# Patient Record
Sex: Male | Born: 1945 | Race: Black or African American | Hispanic: No | Marital: Married
Health system: Southern US, Community
[De-identification: ages and names within clinical notes are randomized; demographics above are authoritative.]

## PROBLEM LIST (undated history)

## (undated) DIAGNOSIS — N189 Chronic kidney disease, unspecified: Secondary | ICD-10-CM

## (undated) DIAGNOSIS — C801 Malignant (primary) neoplasm, unspecified: Secondary | ICD-10-CM

## (undated) DIAGNOSIS — E119 Type 2 diabetes mellitus without complications: Secondary | ICD-10-CM

## (undated) DIAGNOSIS — G709 Myoneural disorder, unspecified: Secondary | ICD-10-CM

## (undated) DIAGNOSIS — R569 Unspecified convulsions: Secondary | ICD-10-CM

## (undated) DIAGNOSIS — K219 Gastro-esophageal reflux disease without esophagitis: Secondary | ICD-10-CM

## (undated) DIAGNOSIS — I10 Essential (primary) hypertension: Secondary | ICD-10-CM

## (undated) HISTORY — PX: JOINT REPLACEMENT: SHX530

## (undated) HISTORY — PX: BACK SURGERY: SHX140

## (undated) HISTORY — PX: INSERTION PROSTATE RADIATION SEED: SUR718

## (undated) HISTORY — PX: TONSILLECTOMY: SUR1361

---

## 1999-09-21 ENCOUNTER — Ambulatory Visit (HOSPITAL_COMMUNITY): Admission: RE | Admit: 1999-09-21 | Discharge: 1999-09-21 | Payer: Self-pay | Admitting: Gastroenterology

## 2000-08-16 ENCOUNTER — Ambulatory Visit (HOSPITAL_COMMUNITY): Admission: RE | Admit: 2000-08-16 | Discharge: 2000-08-16 | Payer: Self-pay | Admitting: Family Medicine

## 2000-08-20 ENCOUNTER — Ambulatory Visit (HOSPITAL_COMMUNITY): Admission: RE | Admit: 2000-08-20 | Discharge: 2000-08-20 | Payer: Self-pay | Admitting: Family Medicine

## 2000-09-08 ENCOUNTER — Emergency Department (HOSPITAL_COMMUNITY): Admission: EM | Admit: 2000-09-08 | Discharge: 2000-09-08 | Payer: Self-pay | Admitting: *Deleted

## 2000-09-11 ENCOUNTER — Encounter: Payer: Self-pay | Admitting: Orthopedic Surgery

## 2000-09-11 ENCOUNTER — Ambulatory Visit (HOSPITAL_COMMUNITY): Admission: RE | Admit: 2000-09-11 | Discharge: 2000-09-11 | Payer: Self-pay | Admitting: Orthopedic Surgery

## 2000-09-14 ENCOUNTER — Emergency Department (HOSPITAL_COMMUNITY): Admission: EM | Admit: 2000-09-14 | Discharge: 2000-09-14 | Payer: Self-pay | Admitting: Emergency Medicine

## 2001-03-05 ENCOUNTER — Encounter: Admission: RE | Admit: 2001-03-05 | Discharge: 2001-06-03 | Payer: Self-pay | Admitting: Internal Medicine

## 2001-08-28 ENCOUNTER — Encounter: Payer: Self-pay | Admitting: Orthopedic Surgery

## 2001-08-28 ENCOUNTER — Ambulatory Visit: Admission: RE | Admit: 2001-08-28 | Discharge: 2001-08-28 | Payer: Self-pay | Admitting: Orthopedic Surgery

## 2001-09-22 ENCOUNTER — Encounter: Admission: RE | Admit: 2001-09-22 | Discharge: 2001-09-22 | Payer: Self-pay | Admitting: Internal Medicine

## 2001-09-22 ENCOUNTER — Encounter: Payer: Self-pay | Admitting: Internal Medicine

## 2001-12-11 ENCOUNTER — Encounter: Payer: Self-pay | Admitting: Gastroenterology

## 2001-12-11 ENCOUNTER — Inpatient Hospital Stay (HOSPITAL_COMMUNITY): Admission: RE | Admit: 2001-12-11 | Discharge: 2001-12-12 | Payer: Self-pay | Admitting: Gastroenterology

## 2002-01-28 ENCOUNTER — Encounter: Payer: Self-pay | Admitting: Internal Medicine

## 2002-01-28 ENCOUNTER — Encounter: Admission: RE | Admit: 2002-01-28 | Discharge: 2002-01-28 | Payer: Self-pay | Admitting: Internal Medicine

## 2002-02-09 ENCOUNTER — Inpatient Hospital Stay (HOSPITAL_COMMUNITY): Admission: RE | Admit: 2002-02-09 | Discharge: 2002-02-13 | Payer: Self-pay | Admitting: Orthopedic Surgery

## 2002-02-09 ENCOUNTER — Encounter: Payer: Self-pay | Admitting: Orthopedic Surgery

## 2003-10-20 ENCOUNTER — Ambulatory Visit (HOSPITAL_COMMUNITY): Admission: RE | Admit: 2003-10-20 | Discharge: 2003-10-20 | Payer: Self-pay | Admitting: Internal Medicine

## 2004-03-13 ENCOUNTER — Ambulatory Visit (HOSPITAL_COMMUNITY): Admission: RE | Admit: 2004-03-13 | Discharge: 2004-03-13 | Payer: Self-pay | Admitting: Gastroenterology

## 2004-03-13 ENCOUNTER — Encounter (INDEPENDENT_AMBULATORY_CARE_PROVIDER_SITE_OTHER): Payer: Self-pay | Admitting: Specialist

## 2004-12-14 ENCOUNTER — Ambulatory Visit (HOSPITAL_COMMUNITY): Admission: RE | Admit: 2004-12-14 | Discharge: 2004-12-14 | Payer: Self-pay | Admitting: Urology

## 2005-01-02 ENCOUNTER — Ambulatory Visit: Admission: RE | Admit: 2005-01-02 | Discharge: 2005-03-22 | Payer: Self-pay | Admitting: Radiation Oncology

## 2005-01-11 ENCOUNTER — Encounter: Admission: RE | Admit: 2005-01-11 | Discharge: 2005-01-11 | Payer: Self-pay | Admitting: Urology

## 2005-04-10 ENCOUNTER — Emergency Department (HOSPITAL_COMMUNITY): Admission: EM | Admit: 2005-04-10 | Discharge: 2005-04-10 | Payer: Self-pay | Admitting: Emergency Medicine

## 2005-05-21 ENCOUNTER — Ambulatory Visit (HOSPITAL_BASED_OUTPATIENT_CLINIC_OR_DEPARTMENT_OTHER): Admission: RE | Admit: 2005-05-21 | Discharge: 2005-05-21 | Payer: Self-pay | Admitting: Urology

## 2005-05-21 ENCOUNTER — Ambulatory Visit (HOSPITAL_COMMUNITY): Admission: RE | Admit: 2005-05-21 | Discharge: 2005-05-21 | Payer: Self-pay | Admitting: Urology

## 2005-05-21 ENCOUNTER — Ambulatory Visit: Admission: RE | Admit: 2005-05-21 | Discharge: 2005-07-04 | Payer: Self-pay | Admitting: Radiation Oncology

## 2006-02-23 ENCOUNTER — Encounter: Admission: RE | Admit: 2006-02-23 | Discharge: 2006-02-23 | Payer: Self-pay | Admitting: *Deleted

## 2006-03-04 ENCOUNTER — Ambulatory Visit (HOSPITAL_COMMUNITY): Admission: RE | Admit: 2006-03-04 | Discharge: 2006-03-04 | Payer: Self-pay | Admitting: *Deleted

## 2006-06-03 ENCOUNTER — Encounter: Admission: RE | Admit: 2006-06-03 | Discharge: 2006-06-03 | Payer: Self-pay | Admitting: Orthopedic Surgery

## 2006-11-30 ENCOUNTER — Inpatient Hospital Stay (HOSPITAL_COMMUNITY): Admission: EM | Admit: 2006-11-30 | Discharge: 2006-11-30 | Payer: Self-pay | Admitting: Emergency Medicine

## 2006-11-30 ENCOUNTER — Ambulatory Visit: Payer: Self-pay | Admitting: Internal Medicine

## 2007-05-30 ENCOUNTER — Encounter: Admission: RE | Admit: 2007-05-30 | Discharge: 2007-05-30 | Payer: Self-pay | Admitting: Internal Medicine

## 2007-12-04 ENCOUNTER — Encounter: Admission: RE | Admit: 2007-12-04 | Discharge: 2007-12-04 | Payer: Self-pay | Admitting: Gastroenterology

## 2008-01-01 ENCOUNTER — Encounter (INDEPENDENT_AMBULATORY_CARE_PROVIDER_SITE_OTHER): Payer: Self-pay | Admitting: Gastroenterology

## 2008-01-01 ENCOUNTER — Ambulatory Visit (HOSPITAL_COMMUNITY): Admission: RE | Admit: 2008-01-01 | Discharge: 2008-01-01 | Payer: Self-pay | Admitting: Gastroenterology

## 2008-05-07 ENCOUNTER — Ambulatory Visit: Payer: Self-pay | Admitting: Physical Medicine & Rehabilitation

## 2008-05-07 ENCOUNTER — Inpatient Hospital Stay (HOSPITAL_COMMUNITY)
Admission: RE | Admit: 2008-05-07 | Discharge: 2008-05-25 | Payer: Self-pay | Admitting: Physical Medicine & Rehabilitation

## 2008-05-25 ENCOUNTER — Inpatient Hospital Stay (HOSPITAL_COMMUNITY): Admission: EM | Admit: 2008-05-25 | Discharge: 2008-05-27 | Payer: Self-pay | Admitting: Emergency Medicine

## 2008-06-16 ENCOUNTER — Encounter
Admission: RE | Admit: 2008-06-16 | Discharge: 2008-08-17 | Payer: Self-pay | Admitting: Physical Medicine & Rehabilitation

## 2008-06-21 ENCOUNTER — Ambulatory Visit: Payer: Self-pay | Admitting: Physical Medicine & Rehabilitation

## 2008-07-01 ENCOUNTER — Encounter
Admission: RE | Admit: 2008-07-01 | Discharge: 2008-07-29 | Payer: Self-pay | Admitting: Physical Medicine & Rehabilitation

## 2008-08-09 ENCOUNTER — Ambulatory Visit: Payer: Self-pay | Admitting: Physical Medicine & Rehabilitation

## 2008-10-05 ENCOUNTER — Ambulatory Visit: Payer: Self-pay | Admitting: Physical Medicine & Rehabilitation

## 2008-10-05 ENCOUNTER — Encounter
Admission: RE | Admit: 2008-10-05 | Discharge: 2008-12-06 | Payer: Self-pay | Admitting: Physical Medicine & Rehabilitation

## 2008-10-19 ENCOUNTER — Encounter
Admission: RE | Admit: 2008-10-19 | Discharge: 2008-12-15 | Payer: Self-pay | Admitting: Physical Medicine & Rehabilitation

## 2008-12-06 ENCOUNTER — Ambulatory Visit: Payer: Self-pay | Admitting: Physical Medicine & Rehabilitation

## 2009-01-13 ENCOUNTER — Encounter
Admission: RE | Admit: 2009-01-13 | Discharge: 2009-04-13 | Payer: Self-pay | Admitting: Physical Medicine & Rehabilitation

## 2009-01-17 ENCOUNTER — Ambulatory Visit: Payer: Self-pay | Admitting: Physical Medicine & Rehabilitation

## 2010-01-06 IMAGING — CR DG ABDOMEN 1V
1 series · 1 of 1 positions shown · non-contrast
Comparison: None.

CLINICAL DATA: A follow-up ileus.

ABDOMEN - 1 VIEW

[t abdomen supine]
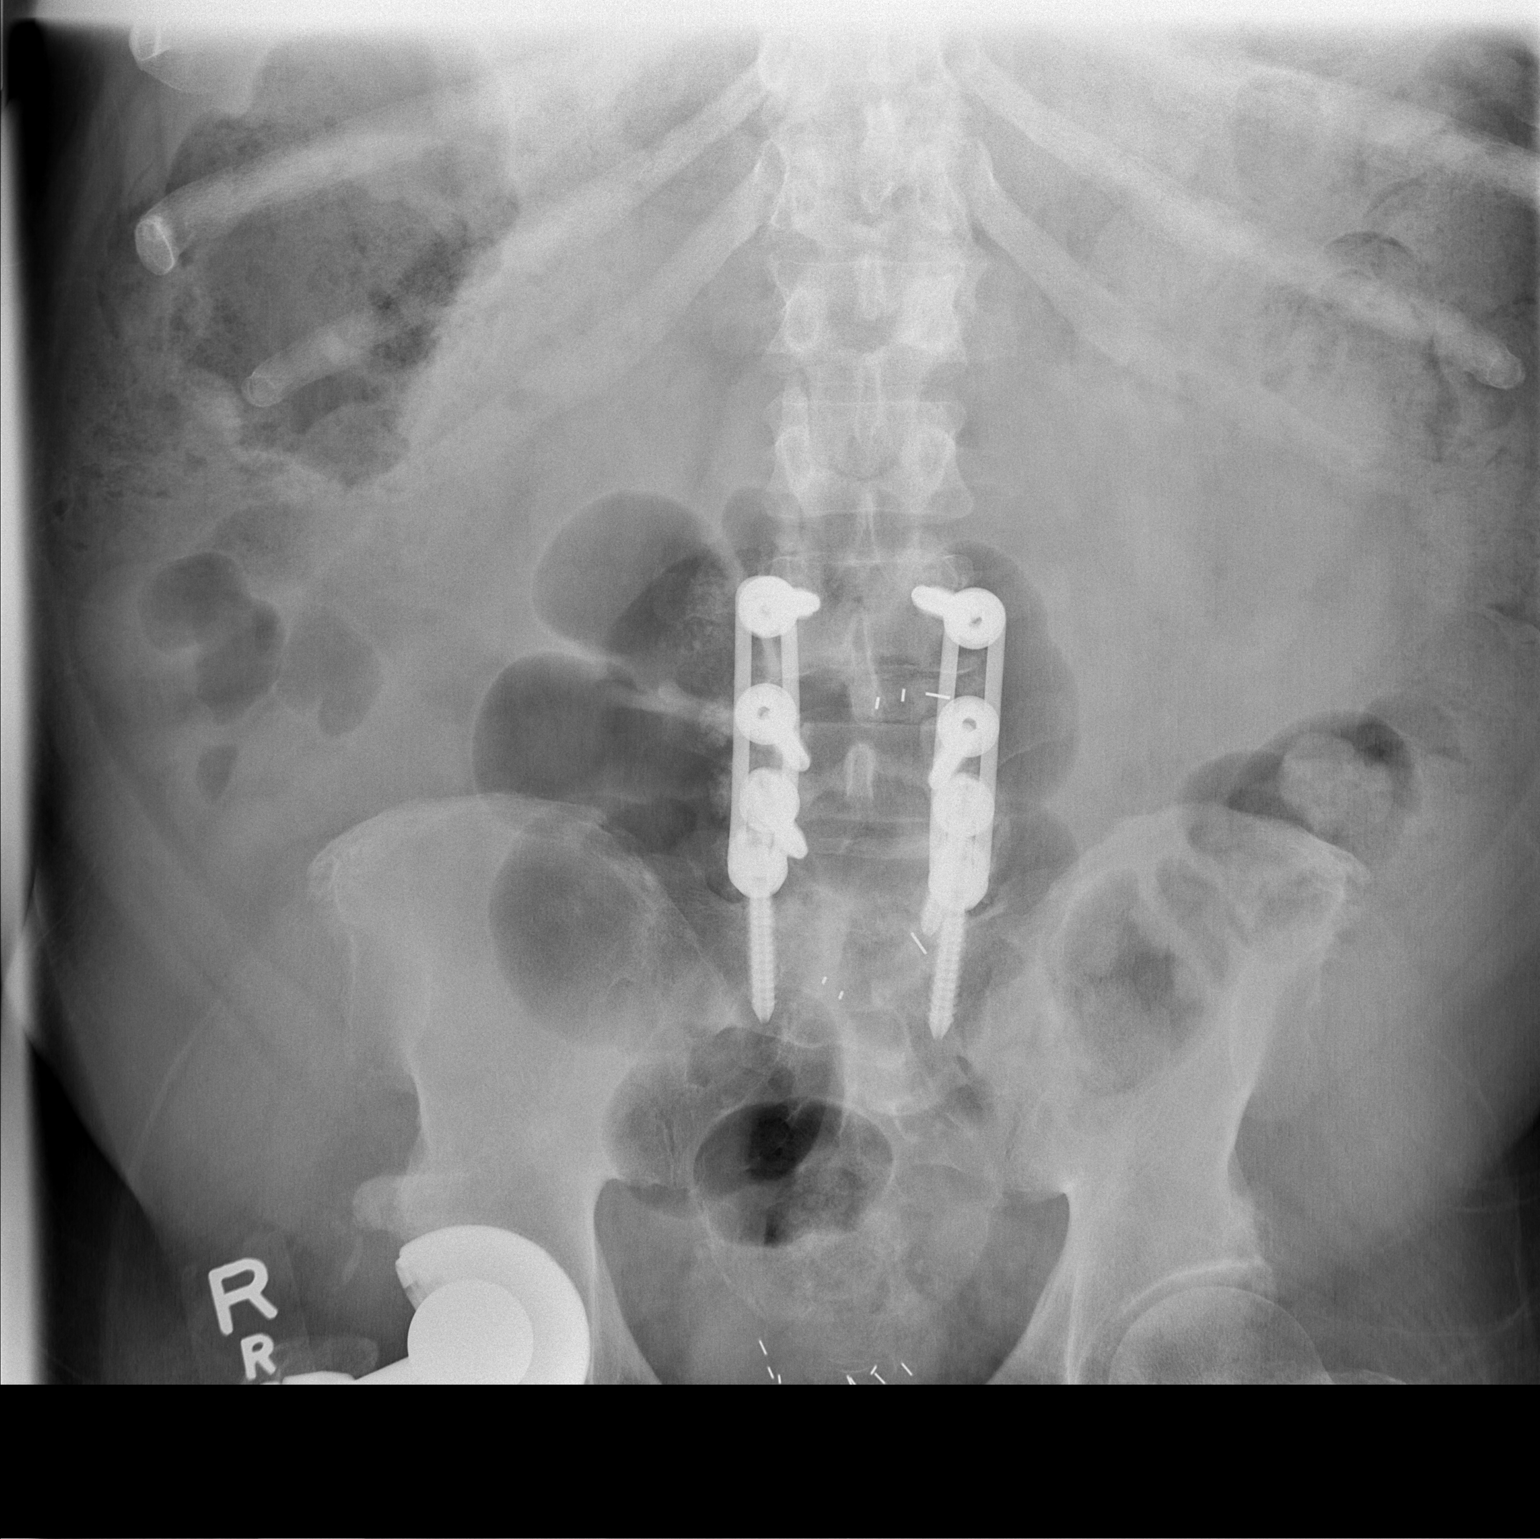

[1 of 1 positions shown; findings below may reference images not displayed]

FINDINGS: Mildly prominent gas filled sigmoid colon.  This is not
abnormally dilated.  Prominent stool throughout the remainder of
the colon, with the exception of the rectum.  No small bowel
dilatation visualized.  Right total hip prosthesis.  Hardware
fixation of the lower lumbar spine.  Prostate radiation seed
implants.
IMPRESSION: No acute abnormality.  Prominent stool.

## 2010-01-21 IMAGING — CT CT ANGIO CHEST
2 of 7 series · 19 of 36 positions shown · IV contrast (APPLIED)
Comparison: Plain film chest 1 day prior.  No prior CT.

CLINICAL DATA: Chest pain.  Syncope.

CT ANGIOGRAPHY CHEST
TECHNIQUE: Multidetector CT imaging of the chest using the
standard protocol during bolus administration of intravenous
contrast. Multiplanar reconstructed images including MIPs were
obtained and reviewed to evaluate the vascular anatomy.
Contrast: 100 ml Cmnipaque-SCC

[Series 8: pulm embolism 1.0 b25f thins · axial · 0.79mm/px · z∈[+1016,+1296]mm · 18 of 314 slices shown]
[im 17/314  lung]
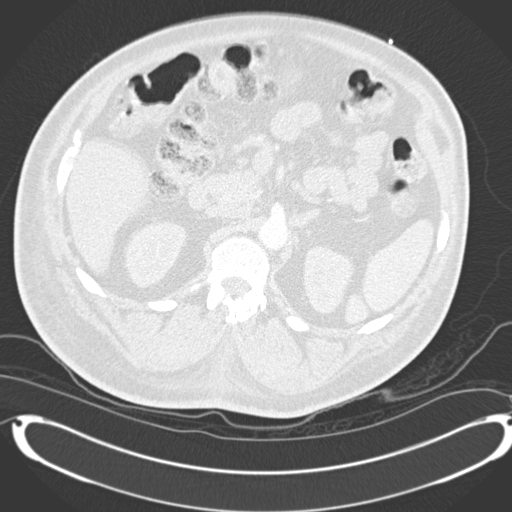
[im 33/314  mediastinal]
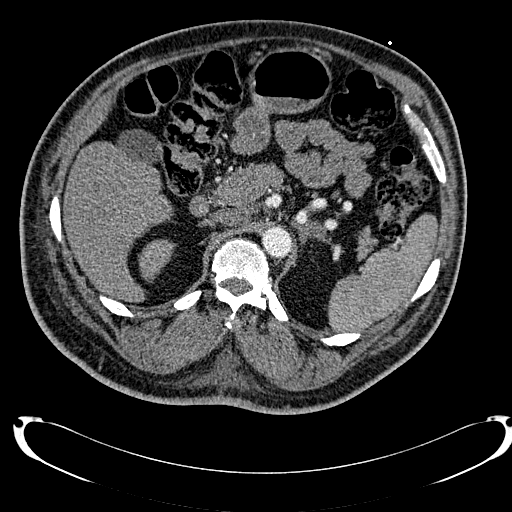
[im 50/314  lung]
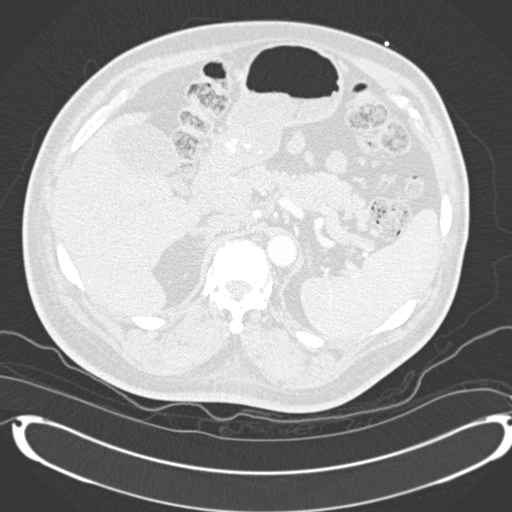
[im 66/314  mediastinal]
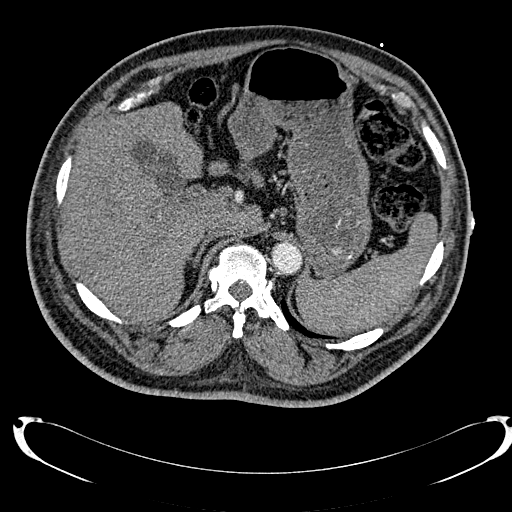
[im 83/314  lung]
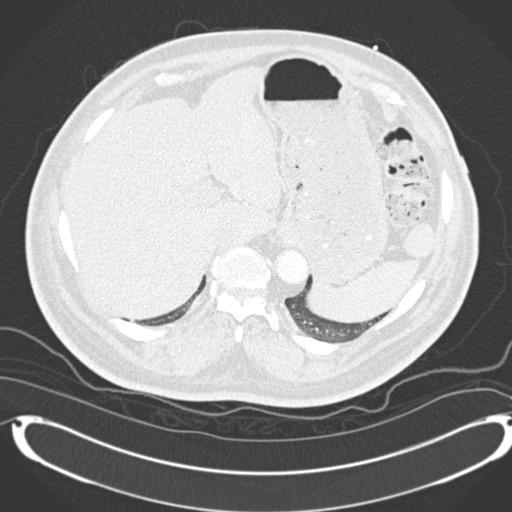
[im 99/314  mediastinal]
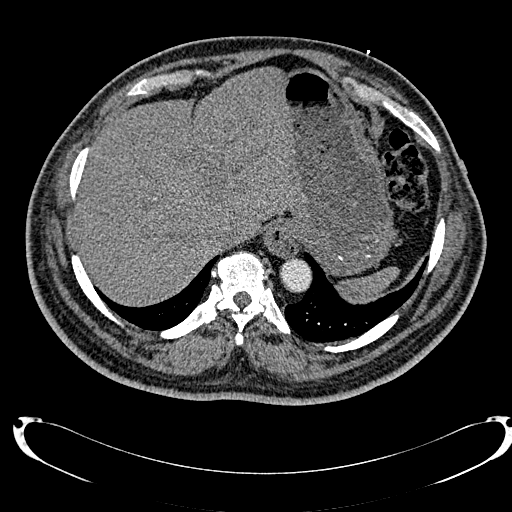
[im 116/314  lung]
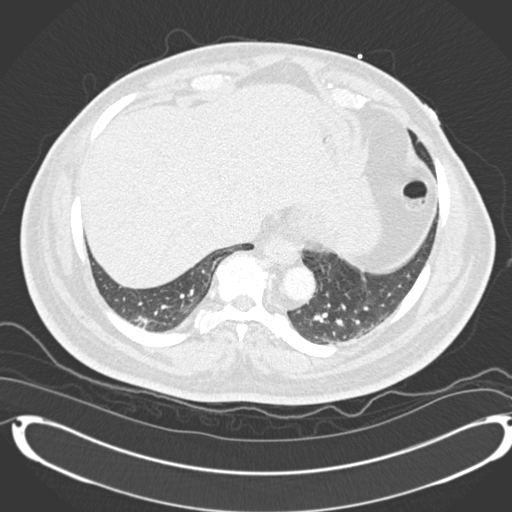
[im 132/314  mediastinal]
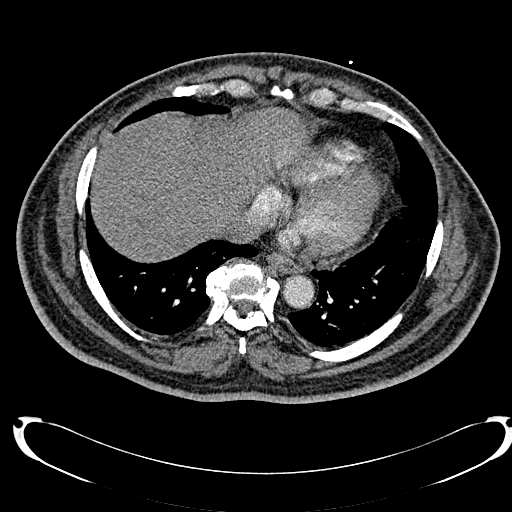
[im 149/314  lung]
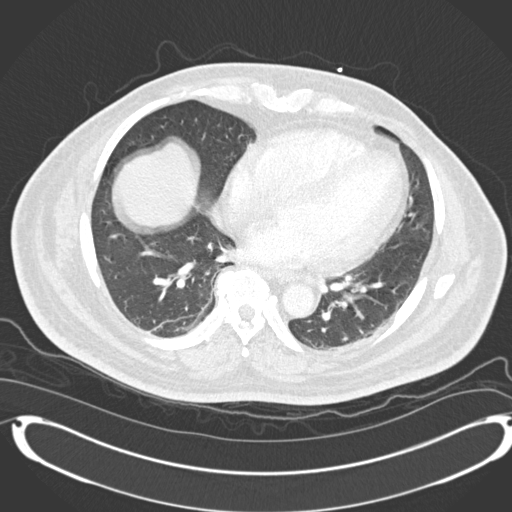
[im 165/314  mediastinal]
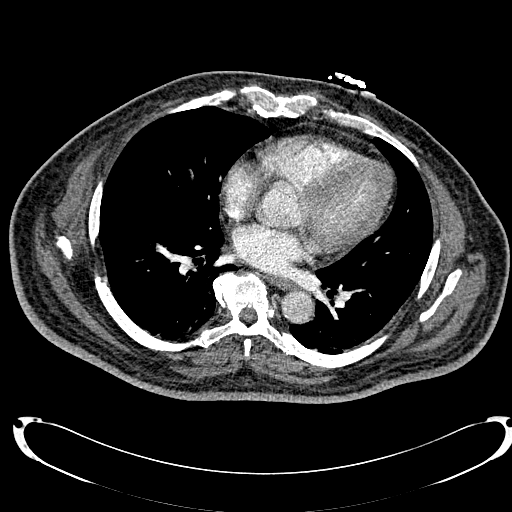
[im 182/314  lung]
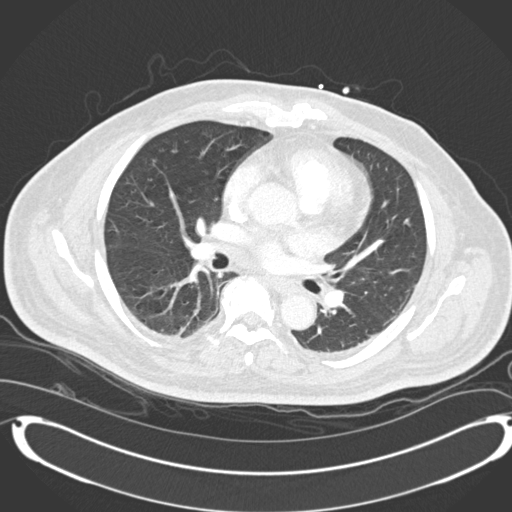
[im 198/314  mediastinal]
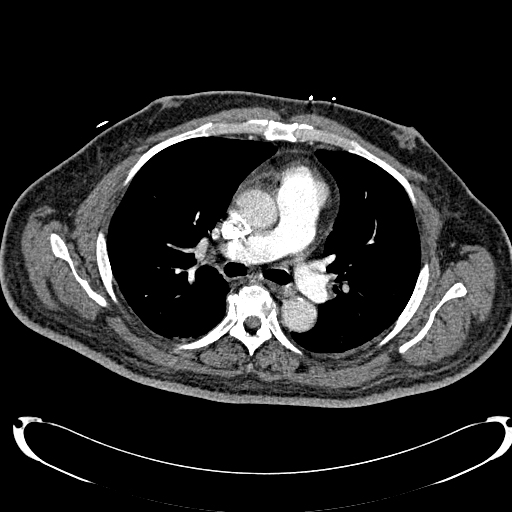
[im 215/314  lung]
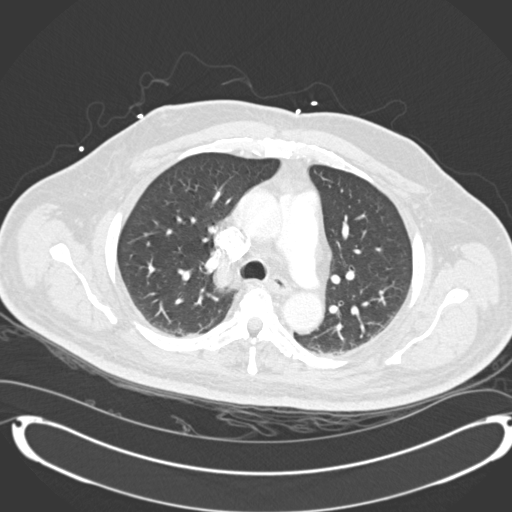
[im 231/314  mediastinal]
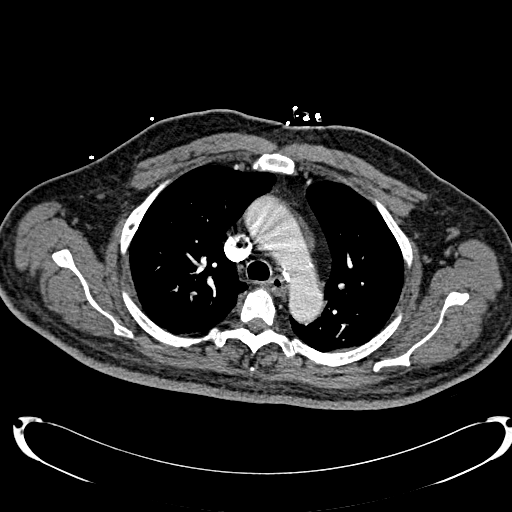
[im 248/314  lung]
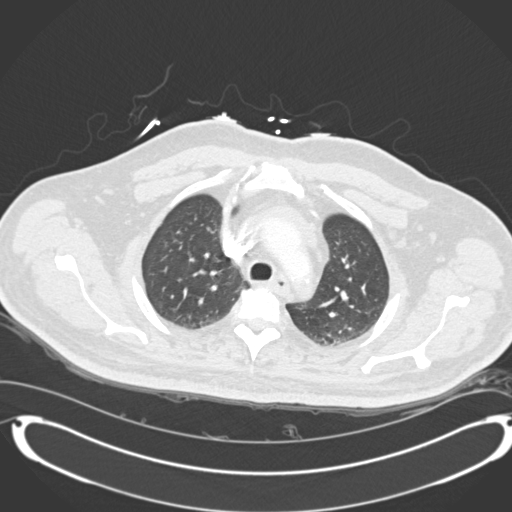
[im 264/314  mediastinal]
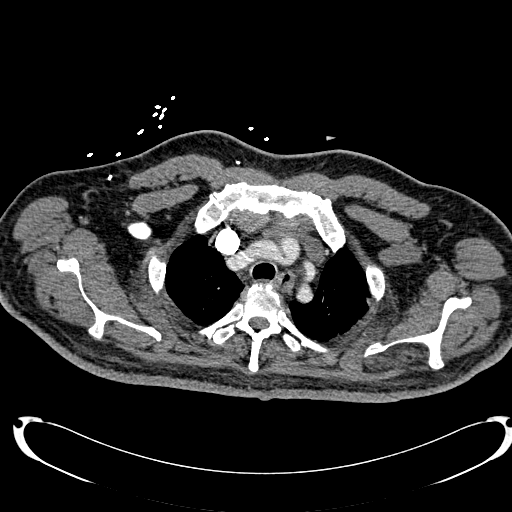
[im 281/314  lung]
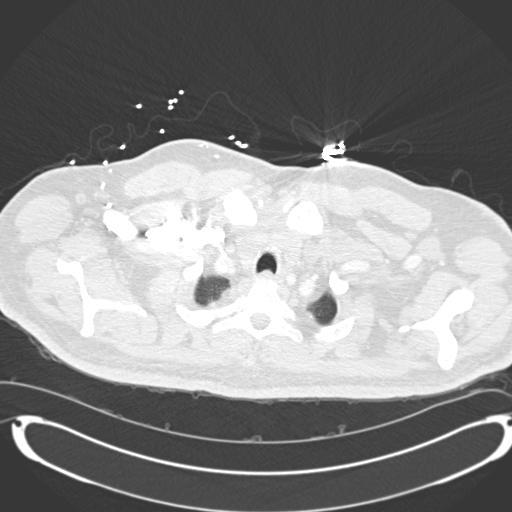
[im 297/314  mediastinal]
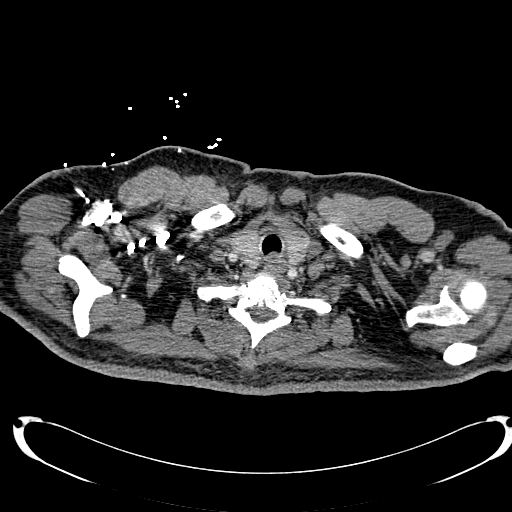

[Series 9: pulm embolism 2.0 spo thins · coronal · 0.69mm/px · 1 of 129 slices shown]
[im 65/129  mediastinal]
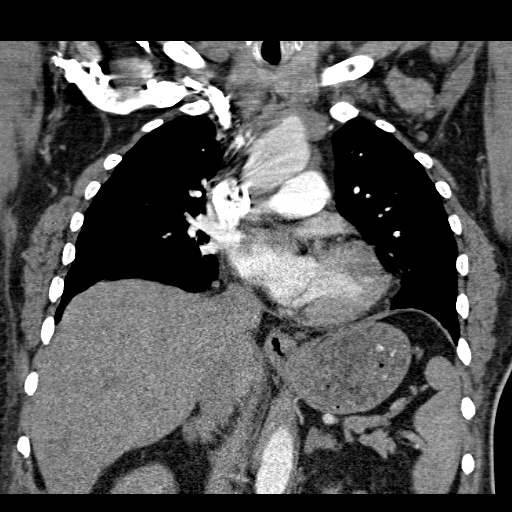

[19 of 36 positions shown; findings below may reference images not displayed]

FINDINGS: Lung windows demonstrate mild motion degradation.  Right
base scar or atelectasis.

Soft tissue windows:  The quality of this exam for evaluation of
pulmonary embolism is moderate to good.  The primary limitation is
the above described motion artifact.  There is a large amount of
contrast remaining in the SVC.  No central or lobar embolism.  No
large segmental embolism.

Normal aortic caliber without dissection.  Mild cardiomegaly with
left ventricular hypertrophy. No pericardial or pleural effusion.
No mediastinal or hilar adenopathy. Small hiatal hernia.

Limited abdominal imaging demonstrates possible gallstone.
IMPRESSION: 1.  No evidence of pulmonary embolism.  Moderate quality exam with
primary limitations involving the upper lobe.
2.  Cardiomegaly.
3.  Small hiatal hernia.
4.  Possible gallstone versus gallbladder fold.

## 2010-07-16 ENCOUNTER — Encounter: Payer: Self-pay | Admitting: Urology

## 2010-08-06 ENCOUNTER — Emergency Department (HOSPITAL_COMMUNITY): Payer: MEDICARE

## 2010-08-06 ENCOUNTER — Inpatient Hospital Stay (HOSPITAL_COMMUNITY)
Admission: EM | Admit: 2010-08-06 | Discharge: 2010-08-10 | DRG: 872 | Disposition: A | Discharge status: Home / Self Care | Payer: MEDICARE | Attending: Internal Medicine | Admitting: Internal Medicine

## 2010-08-06 DIAGNOSIS — K5190 Ulcerative colitis, unspecified, without complications: Secondary | ICD-10-CM | POA: Diagnosis present

## 2010-08-06 DIAGNOSIS — R748 Abnormal levels of other serum enzymes: Secondary | ICD-10-CM | POA: Diagnosis present

## 2010-08-06 DIAGNOSIS — E860 Dehydration: Secondary | ICD-10-CM | POA: Diagnosis present

## 2010-08-06 DIAGNOSIS — E1169 Type 2 diabetes mellitus with other specified complication: Secondary | ICD-10-CM | POA: Diagnosis present

## 2010-08-06 DIAGNOSIS — N319 Neuromuscular dysfunction of bladder, unspecified: Secondary | ICD-10-CM | POA: Diagnosis present

## 2010-08-06 DIAGNOSIS — N390 Urinary tract infection, site not specified: Secondary | ICD-10-CM | POA: Diagnosis present

## 2010-08-06 DIAGNOSIS — M51379 Other intervertebral disc degeneration, lumbosacral region without mention of lumbar back pain or lower extremity pain: Secondary | ICD-10-CM | POA: Diagnosis present

## 2010-08-06 DIAGNOSIS — I10 Essential (primary) hypertension: Secondary | ICD-10-CM | POA: Diagnosis present

## 2010-08-06 DIAGNOSIS — K592 Neurogenic bowel, not elsewhere classified: Secondary | ICD-10-CM | POA: Diagnosis present

## 2010-08-06 DIAGNOSIS — C61 Malignant neoplasm of prostate: Secondary | ICD-10-CM | POA: Diagnosis present

## 2010-08-06 DIAGNOSIS — A419 Sepsis, unspecified organism: Secondary | ICD-10-CM | POA: Diagnosis present

## 2010-08-06 DIAGNOSIS — Z794 Long term (current) use of insulin: Secondary | ICD-10-CM

## 2010-08-06 DIAGNOSIS — K219 Gastro-esophageal reflux disease without esophagitis: Secondary | ICD-10-CM | POA: Diagnosis present

## 2010-08-06 DIAGNOSIS — E785 Hyperlipidemia, unspecified: Secondary | ICD-10-CM | POA: Diagnosis present

## 2010-08-06 DIAGNOSIS — K8309 Other cholangitis: Secondary | ICD-10-CM | POA: Diagnosis present

## 2010-08-06 DIAGNOSIS — M5137 Other intervertebral disc degeneration, lumbosacral region: Secondary | ICD-10-CM | POA: Diagnosis present

## 2010-08-06 LAB — POCT CARDIAC MARKERS
CKMB, poc: 17.3 ng/mL (ref 1.0–8.0)
Myoglobin, poc: >500 ng/mL (ref 12–200)
Troponin i, poc: <0.05 ng/mL (ref 0.00–0.09)

## 2010-08-06 LAB — DIFFERENTIAL
Basophils Absolute: 0 10*3/uL (ref 0.0–0.1)
Eosinophils Relative: 0 % (ref 0–5)
Lymphocytes Relative: 3 % — ABNORMAL LOW (ref 12–46)
Lymphs Abs: 0.7 10*3/uL (ref 0.7–4.0)
Monocytes Absolute: 0.6 10*3/uL (ref 0.1–1.0)
Monocytes Relative: 3 % (ref 3–12)
Neutro Abs: 20.8 10*3/uL — ABNORMAL HIGH (ref 1.7–7.7)
Neutrophils Relative %: 94 % — ABNORMAL HIGH (ref 43–77)

## 2010-08-06 LAB — CK TOTAL AND CKMB (NOT AT ARMC)
CK, MB: 11.6 ng/mL (ref 0.3–4.0)
Relative Index: 1.7 (ref 0.0–2.5)
Total CK: 715 U/L — ABNORMAL HIGH (ref 7–232)

## 2010-08-06 LAB — AMMONIA: Ammonia: 32 umol/L (ref 11–35)

## 2010-08-06 LAB — CBC
HCT: 38.7 % — ABNORMAL LOW (ref 39.0–52.0)
Hemoglobin: 12.8 g/dL — ABNORMAL LOW (ref 13.0–17.0)
MCH: 26.8 pg (ref 26.0–34.0)
MCHC: 33.1 g/dL (ref 30.0–36.0)
MCV: 81.1 fL (ref 78.0–100.0)
Platelets: 265 10*3/uL (ref 150–400)
RBC: 4.77 MIL/uL (ref 4.22–5.81)
RDW: 13.8 % (ref 11.5–15.5)
WBC: 22.1 10*3/uL — ABNORMAL HIGH (ref 4.0–10.5)

## 2010-08-06 LAB — BASIC METABOLIC PANEL
BUN: 19 mg/dL (ref 6–23)
CO2: 26 mEq/L (ref 19–32)
Calcium: 9.8 mg/dL (ref 8.4–10.5)
Chloride: 103 mEq/L (ref 96–112)
Creatinine, Ser: 1.63 mg/dL — ABNORMAL HIGH (ref 0.4–1.5)
GFR calc Af Amer: 52 mL/min — ABNORMAL LOW (ref >60)
GFR calc non Af Amer: 43 mL/min — ABNORMAL LOW (ref >60)
Glucose, Bld: 78 mg/dL (ref 70–99)
Potassium: 3.3 mEq/L — ABNORMAL LOW (ref 3.5–5.1)
Sodium: 140 mEq/L (ref 135–145)

## 2010-08-06 LAB — TROPONIN I: Troponin I: 0.06 ng/mL (ref 0.00–0.06)

## 2010-08-07 LAB — DIFFERENTIAL
Basophils Absolute: 0 10*3/uL (ref 0.0–0.1)
Basophils Relative: 0 % (ref 0–1)
Eosinophils Absolute: 0 10*3/uL (ref 0.0–0.7)
Monocytes Relative: 7 % (ref 3–12)
Neutro Abs: 18.7 10*3/uL — ABNORMAL HIGH (ref 1.7–7.7)
Neutrophils Relative %: 89 % — ABNORMAL HIGH (ref 43–77)

## 2010-08-07 LAB — CBC
Hemoglobin: 9.8 g/dL — ABNORMAL LOW (ref 13.0–17.0)
MCH: 26.5 pg (ref 26.0–34.0)
MCHC: 32.8 g/dL (ref 30.0–36.0)
Platelets: 236 10*3/uL (ref 150–400)
RBC: 3.7 MIL/uL — ABNORMAL LOW (ref 4.22–5.81)

## 2010-08-07 LAB — LIPID PANEL
HDL: 27 mg/dL — ABNORMAL LOW (ref >39)
LDL Cholesterol: 81 mg/dL (ref 0–99)
Total CHOL/HDL Ratio: 4.9 RATIO
Triglycerides: 113 mg/dL (ref <150)
VLDL: 23 mg/dL (ref 0–40)

## 2010-08-07 LAB — URINALYSIS, ROUTINE W REFLEX MICROSCOPIC
Specific Gravity, Urine: 1.02 (ref 1.005–1.030)
Urine Glucose, Fasting: NEGATIVE mg/dL
pH: 6 (ref 5.0–8.0)

## 2010-08-07 LAB — URINE MICROSCOPIC-ADD ON

## 2010-08-07 LAB — BASIC METABOLIC PANEL
BUN: 23 mg/dL (ref 6–23)
Chloride: 103 mEq/L (ref 96–112)
Creatinine, Ser: 1.65 mg/dL — ABNORMAL HIGH (ref 0.4–1.5)
GFR calc Af Amer: 51 mL/min — ABNORMAL LOW (ref >60)
GFR calc non Af Amer: 42 mL/min — ABNORMAL LOW (ref >60)

## 2010-08-07 LAB — CK TOTAL AND CKMB (NOT AT ARMC)
CK, MB: 17.4 ng/mL (ref 0.3–4.0)
Total CK: 1642 U/L — ABNORMAL HIGH (ref 7–232)

## 2010-08-07 LAB — GLUCOSE, CAPILLARY
Glucose-Capillary: 182 mg/dL — ABNORMAL HIGH (ref 70–99)
Glucose-Capillary: 199 mg/dL — ABNORMAL HIGH (ref 70–99)
Glucose-Capillary: 216 mg/dL — ABNORMAL HIGH (ref 70–99)
Glucose-Capillary: 327 mg/dL — ABNORMAL HIGH (ref 70–99)

## 2010-08-07 LAB — HEMOGLOBIN A1C
Hgb A1c MFr Bld: 6.8 % — ABNORMAL HIGH (ref <5.7)
Mean Plasma Glucose: 148 mg/dL — ABNORMAL HIGH (ref <117)

## 2010-08-07 LAB — LACTIC ACID, PLASMA: Lactic Acid, Venous: 1.8 mmol/L (ref 0.5–2.2)

## 2010-08-08 LAB — DIFFERENTIAL
Basophils Absolute: 0 10*3/uL (ref 0.0–0.1)
Lymphocytes Relative: 9 % — ABNORMAL LOW (ref 12–46)
Lymphs Abs: 1.3 10*3/uL (ref 0.7–4.0)
Monocytes Absolute: 0.7 10*3/uL (ref 0.1–1.0)
Monocytes Relative: 5 % (ref 3–12)
Neutro Abs: 12.2 10*3/uL — ABNORMAL HIGH (ref 1.7–7.7)

## 2010-08-08 LAB — GLUCOSE, CAPILLARY: Glucose-Capillary: 161 mg/dL — ABNORMAL HIGH (ref 70–99)

## 2010-08-08 LAB — CBC
HCT: 29.9 % — ABNORMAL LOW (ref 39.0–52.0)
Hemoglobin: 9.8 g/dL — ABNORMAL LOW (ref 13.0–17.0)
MCHC: 32.8 g/dL (ref 30.0–36.0)
MCV: 80.4 fL (ref 78.0–100.0)

## 2010-08-08 LAB — BASIC METABOLIC PANEL
BUN: 28 mg/dL — ABNORMAL HIGH (ref 6–23)
CO2: 24 mEq/L (ref 19–32)
Calcium: 9 mg/dL (ref 8.4–10.5)
Glucose, Bld: 234 mg/dL — ABNORMAL HIGH (ref 70–99)
Sodium: 138 mEq/L (ref 135–145)

## 2010-08-08 LAB — URINE CULTURE
Culture  Setup Time: 201202131158
Special Requests: NEGATIVE

## 2010-08-08 LAB — CARDIAC PANEL(CRET KIN+CKTOT+MB+TROPI)
CK, MB: 5.4 ng/mL — ABNORMAL HIGH (ref 0.3–4.0)
Troponin I: 0.11 ng/mL — ABNORMAL HIGH (ref 0.00–0.06)

## 2010-08-09 LAB — URINALYSIS, MICROSCOPIC ONLY
Bilirubin Urine: NEGATIVE
Ketones, ur: NEGATIVE mg/dL
Nitrite: NEGATIVE
Protein, ur: 30 mg/dL — AB
Specific Gravity, Urine: 1.013 (ref 1.005–1.030)
Urine Glucose, Fasting: NEGATIVE mg/dL
Urobilinogen, UA: 1 mg/dL (ref 0.0–1.0)
pH: 6 (ref 5.0–8.0)

## 2010-08-09 LAB — CBC
HCT: 28.9 % — ABNORMAL LOW (ref 39.0–52.0)
Hemoglobin: 9.4 g/dL — ABNORMAL LOW (ref 13.0–17.0)
MCHC: 32.5 g/dL (ref 30.0–36.0)
MCV: 80.5 fL (ref 78.0–100.0)
WBC: 11.8 10*3/uL — ABNORMAL HIGH (ref 4.0–10.5)

## 2010-08-09 LAB — GLUCOSE, CAPILLARY
Glucose-Capillary: 204 mg/dL — ABNORMAL HIGH (ref 70–99)
Glucose-Capillary: 185 mg/dL — ABNORMAL HIGH (ref 70–99)

## 2010-08-09 LAB — BASIC METABOLIC PANEL
CO2: 27 mEq/L (ref 19–32)
Glucose, Bld: 191 mg/dL — ABNORMAL HIGH (ref 70–99)
Potassium: 3.9 mEq/L (ref 3.5–5.1)
Sodium: 142 mEq/L (ref 135–145)

## 2010-08-10 LAB — BASIC METABOLIC PANEL
CO2: 25 mEq/L (ref 19–32)
Calcium: 9.4 mg/dL (ref 8.4–10.5)
Creatinine, Ser: 1.18 mg/dL (ref 0.4–1.5)
Glucose, Bld: 158 mg/dL — ABNORMAL HIGH (ref 70–99)

## 2010-08-13 LAB — WOUND CULTURE

## 2010-08-18 NOTE — Discharge Summary (Signed)
NAME:  Hunter Moss, Hunter Moss NO.:  [id number]  MEDICAL RECORD NO.:  [id number]           PATIENT TYPE:  I  LOCATION:  1427                         FACILITY:  [facility]  PHYSICIAN:  Thora Lance, [name]  DATE OF BIRTH:  December 21, 1945  DATE OF ADMISSION:  08/06/2010 DATE OF DISCHARGE:                              DISCHARGE SUMMARY   REASON FOR ADMISSION:  65 year old white male with multiple medical problems, was brought to emergency room by ambulance, has been having confusion and low blood sugar in the 40s.  In the ER, was found to be hypertensive with blood pressure systolic in the 80s.  The patient had been having trouble with hypoglycemia several days prior to admission. He was given IV dextrose and blood sugar improved with increased responsiveness.  SIGNIFICANT FINDINGS:  VITAL SIGNS:  Blood pressure 113/70, heart rate 113, respirations 20, temperature 99.5. LUNGS:  Clear. HEART:  Regular rate and rhythm. ABDOMEN:  Soft, nontender, normal bowel sounds. EXTREMITIES:  No edema. NEUROLOGIC:  Nonfocal, except for 3 out of 5 strength in both lower extremities.  LABORATORY DATA:  WBC 22.1, hemoglobin 12.8, platelet count 265,000. Sodium 140, potassium 3.5, chloride 103, bicarbonate 26, BUN 19, creatinine 1.63, glucose 78, myoglobin 500, CK-MB 17.3, troponin-I less than 0.05, ammonia level 32.  Urinalysis, large leukocyte esterase, positive nitrite, wbc's too numerous to count, rbc's 11, many bacteria.  HOSPITAL COURSE:  The patient was admitted with sepsis related to urinary tract infection.  He was given Rocephin and IV fluids and his hypotension quickly resolved.  He was then switched to vancomycin and Zosyn.  His urine culture surprisingly showed no growth.  He did spike a fever to 103 on his first hospital day and then his fever curve generally improved and at discharge, she was afebrile.  Followup urinalysis showed 7 to 10 wbc's.  He clinically improved with  increasing appetite.  He was seen by occupational therapy and able to get up and transfer to his wheelchair during the hospitalization.  His blood sugars initially were low but then rose in to the 300s and he was started on Lantus and sliding scale insulin.  When his creatinine dropped from 1.6 to 1.2, his metformin was restarted.  IV fluids were discontinued.  His troponin level did rise to a level of 0.21 on February 13.  He never had any chest pain and followup cardiac panel showed troponin of 0.11.  He was felt to have some evidence of possible myocardial ischemia secondary to his hypotension and sepsis.  We will consider working this up further as an outpatient.  At discharge, the patient was eating well and was discharged in good condition.  DISCHARGE DIAGNOSES: 1. Sepsis with urinary tract infection. 2. Diabetes mellitus. 3. Hypoglycemia. 4. Hypertension. 5. Hyperlipidemia. 6. Gastroesophageal reflux disease. 7. Lumbosacral degenerative disk disease. 8. Primary sclerosing cholangitis. 9. Ulcerative colitis. 10.Prostate cancer. 11.Neurogenic bowel and bladder.  PROCEDURES:  None.  DISCHARGE MEDICATIONS: 1. Insulin glargine 30 units subcutaneous daily. 2. Levaquin 500 mg 1 tablet once a day for 7 days. 3. NovoLog insulin 10 units before each meal. 4. Amlodipine 5  mg once a day. 5. Aspirin 325 mg once a day. 6. Benazepril 40 mg once a day. 7. Glipizide ER 10 mg once a day. 8. Metformin 1000 mg twice a day. 9. Multivitamin 1 tablet once a day. 10.Protonix 40 mg once a day. 11.Ursodiol 500 mg twice a day. 12.Hydrochlorothiazide was discontinued.  DISPOSITION:  Discharged to home.  CODE STATUS:  Full code.  FOLLOWUP:  Follow up in 10 days with Dr. Valentina Lucks, home RN and physical therapy set up.  DIET:  Low-sodium, carbohydrate modified diet.          ______________________________ Thora Lance, [name]     JJG/MEDQ  D:  08/10/2010  T:  08/10/2010  Job:   [id number]  Electronically Signed by Kirby Funk [name] on 08/18/2010 12:35:56 PM

## 2010-08-28 NOTE — H&P (Signed)
NAME:  Hunter Moss, Hunter Moss NO.:  [id number]  MEDICAL RECORD NO.:  [id number]           PATIENT TYPE:  E  LOCATION:  WLED                         FACILITY:  [facility]  PHYSICIAN:  Della Goo, [name] DATE OF BIRTH:  1945-08-15  DATE OF ADMISSION:  08/06/2010 DATE OF DISCHARGE:                             HISTORY & PHYSICAL   DATE OF ADMISSION:  August 07, 2010.  PRIMARY CARE PHYSICIAN:  Thora Lance, [name]  CHIEF COMPLAINT:  Confusion, low blood sugar.  HISTORY OF PRESENT ILLNESS:  This is a 65 year old male with multiple medical problems who was brought to the emergency department by ambulance after having confusion and low blood sugar into the 40s.  When EMS arrived, the patient was also found to be hypotensive with blood pressures in the 80s.  On the scene, they also checked the patient's blood sugars and found to be 96 on the scene.  The patient has been having difficulty with his blood sugars and having low blood sugars. EMS has been called out the day before and his blood sugar was found to be in the 40s.  The patient was given IV dextrose and his blood sugar improved.  The patient became more responsive at that time but refused to go to the hospital during that day.  The patient reports having poor appetite over the past 3 months and not being able to eat.  He reports having nausea and dry heaves.  His wife reports that he has not eaten any foods since 5 p.m. on Saturday which is a day-and-a-half ago.  He also has not been drinking any fluids.  The patient denies having any fevers or chills.  PAST MEDICAL HISTORY:  Significant for insulin-dependent diabetes mellitus, hypertension, hyperlipidemia, gastroesophageal reflux disease, lumbar disk disease status post laminectomy and diskectomy at L3-L4 and L4-L5, history of primary sclerosing cholangitis, ulcerative colitis, status post right total hip replacement, history of prostate cancer status post  radiation seed implant, bilateral lower extremity numbness and left-sided footdrop.  The patient has lower extremity spasticity, left greater than right, and he has lumbosacral stenosis with myelopathy and neurogenic bowel and bladder.  MEDICATIONS:  His medications include ursodiol, metformin, benazepril, glipizide, amlodipine, aspirin, Protonix, multivitamin, NovoLog insulin.  ALLERGIES:  BACLOFEN and ZOCOR.  SOCIAL HISTORY:  The patient is married.  He is wheelchair bound.  He is a nonsmoker, nondrinker.  No history of illicit drug usage.  FAMILY HISTORY:  Positive for colon cancer.  REVIEW OF SYSTEMS:  Pertinents as mentioned above.  PHYSICAL EXAMINATION FINDINGS:  GENERAL:  This is a pleasant 65 year old well-nourished, well-developed African-American male who is in discomfort but no acute distress currently. VITAL SIGNS:  His vital signs on admission, temperature 99.5, blood pressure 113/70, heart rate 113 to 118.  Respirations 20 to 36, initially 36 but now 20.  O2 sats 97% to 98%. HEENT EXAMINATION:  Normocephalic, atraumatic.  Pupils equally round and reactive to light.  Extraocular movements are intact.  Funduscopic benign.  There is no scleral icterus.  Nares are patent bilaterally. Oropharynx is dry.  There are no exudates. NECK:  Supple.  Full range of motion.  No thyromegaly, adenopathy, jugular venous distention. CARDIOVASCULAR:  Tachycardic rate and rhythm.  No murmurs, gallops or rubs appreciated. LUNGS:  Clear to auscultation bilaterally.  No rales, rhonchi or wheezes. ABDOMEN:  Positive bowel sounds, soft, nontender, nondistended.  No hepatosplenomegaly.  No rebound, no guarding. EXTREMITIES:  Without cyanosis, clubbing or edema.  The patient has chronic venous stasis changes on both of his lower legs. NEUROLOGIC EXAMINATION:  The patient is alert and oriented x3 at this time.  His cranial nerves are all intact.  Speech is clear.  His upper extremities have 5  out of 5 strength.  His lower extremities have a 3 out of 5 strength and this is chronic bilaterally.  LABORATORY STUDIES:  White blood cell count 22.1, hemoglobin 12.8, hematocrit 38.7, platelets 265, neutrophils 94%, lymphocytes 3%.  Sodium 140, potassium 3.3, chloride 103, carbon dioxide 26, BUN 19, creatinine 1.63 and glucose 78.  Point-of-care cardiac markers with a myoglobin greater than 500, CK-MB 17.3, troponin less than 0.05.  Ammonia level 32, troponin 0.06, total CK 715, CK-MB 11.6 and relative index 1.7. Urinalysis reveals large leukocyte esterase, positive urine nitrites, total protein of 100, large hemoglobin.  Urine microscopic, few epithelials, white blood cells too numerous to count, red blood cells 11 to 20 and many bacteria.  EKG reveals sinus tachycardia.  No acute ST- segment changes seen now.  ASSESSMENT:  This is a 65 year old male being admitted with: 1. Altered mental status. 2. Hypotension. 3. Hypoglycemic episodes. 4. Urinary tract infection. 5. Anorexia. 6. Diabetes mellitus type 2, on insulin treatment. 7. Dehydration.  PLAN:  The patient will be admitted to telemetry area for monitoring. Cardiac enzymes will be performed.  The patient has been placed on IV fluids for gentle rehydration and a urine culture and sensitivity has been ordered.  The patient has been placed on IV antibiotic therapy of Rocephin empirically which will be adjusted pending culture results.  His regular medications will be further verified and reconciled and the patient will be placed on DVT prophylaxis.  Sliding- scale insulin coverage has been ordered and the patient's blood sugars will be monitored and the hypoglycemic protocol will also be followed if needed.  The patient's code status will be further verified.     Della Goo, [name]     HJ/MEDQ  D:  08/07/2010  T:  08/07/2010  Job:  [id number]  cc:   Thora Lance, [name] Fax: [phone]  Electronically Signed by  Della Goo [name] on 08/28/2010 08:16:39 PM

## 2010-11-07 NOTE — Assessment & Plan Note (Signed)
Hunter Moss is back regarding his lumbosacral stenosis and myelopathy.  We had performed Botox injections on October 05, 2008, to the left biceps  femoris, semimembranosus, and tendinosis as well as gastrocnemius.  Hunter Moss notes some improvement.  He is walking more and now taking some  steps at home and with therapy.  He still has a lot of anxiety with his  standing and his balance.  He states that therapist has mentioned JAS  splint for his left ankle range of motion.  He denies pain.  He still  has some spasms when he stands.  There is some difficulty straightening  the leg out and swing phase and stance.  He does feel that he is  experiencing some improvement.   REVIEW OF SYSTEMS:  Notable for some depression at times as well as limb  swelling.  Other pertinent positives are listed above.  Full review is  in the written health and history section of the chart.   SOCIAL HISTORY:  The patient is married living with his wife today.   PHYSICAL EXAMINATION:  Blood pressure 158/77, pulse is 92, respiratory  rate 18, he is sating 96% on room air.  The patient is pleasant, alert,  and oriented x3.  Affect showing bright and appropriate.  Strength is  improved in the left lower extremity to 2/5 at the hip, 2+ to 3/5 at the  knee, 2/5 ankle plantar flexion, 1/5 ankle dorsiflexion.  Right lower  extremity is 3 to 3+/5.  He has a catch at the left hamstring still  depending on positioning from trace to 2/5.  Overall, the tone was much  improved.  He has some tone at 1+ to 2 at the left ankle but mostly heel  cord contracture.  I was able to range him passively to neutral  position.  Heart is regular.  Chest is clear.  Abdomen is soft,  nontender.  The patient's sensory exam was diminished on the left side  at 1/2 with decreased pinprick, light touch, and proprioception.  Reflexes are 2+ in bilateral lower extremities.  The patient remains  overweight.   ASSESSMENT:  1. Lumbosacral stenosis with  myelopathy, neurogenic bowel and bladder.  2. Lower extremity spasticity, left greater than right.  3. Hypertension.   PLAN:  1. We are at the point where Hunter Moss can really push himself from a      physical aspect with range of motion stretching and gait exercises.      He will continue work with physical therapy if required but also      needs to work at home.  I think he is improving to the point where      we can start looking at orthotics particularly a left AFO perhaps      left KAFO.  2. Additionally, I like to start looking towards aquatic therapy which      may transition him over from his current level function to a more      ambulatory level.  3. We will hold off any further Botox injections at this point.  I      have seen a lot of improvement on his exam already today.  4. I will see him back in about 6 weeks' time for followup.  We will      help transition him from outpatient land PT to aquatic PT over that      period.      Ranelle Oyster, [name]  Electronically Signed  ZTS/MedQ  D:  12/06/2008 13:24:17  T:  12/07/2008 03:02:38  Job #:  [id number]   cc:   Thora Lance, [name]  Fax: [phone]

## 2010-11-07 NOTE — H&P (Signed)
NAME:  Hunter Moss, Hunter Moss NO.:  [id number]   MEDICAL RECORD NO.:  [id number]          PATIENT TYPE:  EMS   LOCATION:  ED                           FACILITY:  [facility]   PHYSICIAN:  Corwin Levins, MD      DATE OF BIRTH:  Apr 03, 1946   DATE OF ADMISSION:  11/29/2006  DATE OF DISCHARGE:                              HISTORY & PHYSICAL   CHIEF COMPLAINT:  Syncope earlier this evening.   HISTORY OF PRESENT ILLNESS:  Hunter Moss is a 65 year old black male here  with an episode of the above, after 45 minutes of being outdoors.  He  was sitting mostly but was helping move an organ in 100 degree heat  about 4 p.m. this afternoon. He was sitting and started feeling weak,  clammy, sweaty, and disoriented. He initially thought that he was having  a low sugar and asked for something to eat and the wife went to get this  but before she came back, he passed out and slumped in the chair. There  was no fall or injury. He denied any chest pain, shortness of breath, or  other symptoms except some nausea and light headedness just prior to the  episode. He was out for about 3 minutes total. It took about 6 minutes,  he thinks, to become more lucid after he came to. No seizure activity.  No other symptoms. CBG per EMS on arrival was 117.   PAST MEDICAL HISTORY:  1. History of prostate cancer status post seed radiation therapy in      November 2006, bone scan negative.  2. Diabetes mellitus.  3. Hypercholesterolemia.  4. Hypertension.  5. GERD.  6. History of proctocolitis, universal, last colonoscopy 2005.  7. History of cholelithiasis.  8. Primary sclerosing cholangitis by ERCP June 2003.  9. Bilateral scrotal hydrocele.  10.Status post right total hip replacement after a crush injury with      resultant DJD.   ALLERGIES:  NO KNOWN DRUG ALLERGIES.   CURRENT MEDICATIONS:  Lasix 40 mg p.o. daily, Metformin ER 500 mg 2 p.o.  b.i.d., Glipizide ER 10 mg 1 daily, Norvasc 10 mg daily,  Protonix 40 mg  daily, HCTZ 25 mg p.o. daily, Benazepril 40 mg daily, Lantus 25 units SQ  q. a.m. and 20 units SQ q. p.m., Klor-Con 20 meq p.o. daily, aspirin 325  mg 1 p.o. daily.   SOCIAL HISTORY:  No tobacco, no alcohol. Married with his wife. 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   FAMILY HISTORY:  History of prostate cancer and diabetes.   REVIEW OF SYSTEMS:  Otherwise, noncontributory.   PHYSICAL EXAMINATION:  VITAL SIGNS:  Currently with temperature of 99.4.  O2 saturation is 95% on room air. Respiratory rate 20. Pulse 89. Blood  pressure on admission 123/73 lying and 91/58 sitting up.  HEENT:  Sclerae clear. TM's clear. Pharynx benign.  NECK:  Without lymphadenopathy, JVD, thyromegaly.  CHEST:  No rales or wheezing.  CARDIOVASCULAR:  Regular rate and rhythm.  ABDOMEN:  Soft, nontender. Positive bowel sounds.  EXTREMITIES:  No edema.  NEUROLOGIC:  Alert and oriented times three. Otherwise, non-focal.   LABORATORY DATA:  UA negative. BNP less than 30. C-met with SGOT 112,  SGPT 189, otherwise normal. Hemoglobin 11.6. White blood cell count 7.3.  CPK MB negative x1, troponin I of less than 0.05.   Chest x-ray, no acute disease.   EKG:  Sinus rhythm, no acute changes.   ASSESSMENT/PLAN:  1. Syncope, mostly consistent with vasovagal/dehydration related to      HCTZ use and heat exposure today. He was orthostatic on arrival to      the ER. CBG okay. EKG and other workup unrevealing as above. He is      to be admitted. Apply O2. Give him further IV fluids. Apply      telemetry. Rule out  MI with cardiac enzymes. Consider discharge in      the a.m. if improved with outpatient followup for cardiovascular      evaluation, 2-D echocardiogram, and stress testing.  2. Diabetes mellitus.Check CBGs and apply sliding scale insulin.      Continue otherwise, with home medication.  3. Other medical problems as listed above. Continue home medications.  4. Prophylaxis. Start PPI  therapy. Give Lovenox subcutaneously.   DISPOSITION:  To home when improved.   CODE STATUS:  FULL CODE.      Corwin Levins, MD  Electronically Signed     JWJ/MEDQ  D:  11/30/2006  T:  11/30/2006  Job:  [id number]   cc:   Thora Lance, [name]  Fax: [phone]

## 2010-11-07 NOTE — Discharge Summary (Signed)
NAME:  Hunter Moss, STATZER NO.:  [id number]   MEDICAL RECORD NO.:  [id number]          PATIENT TYPE:  INP   LOCATION:  3733                         FACILITY:  [facility]   PHYSICIAN:  Thora Lance, [name]  DATE OF BIRTH:  Jul 17, 1945   DATE OF ADMISSION:  05/25/2008  DATE OF DISCHARGE:  05/27/2008                               DISCHARGE SUMMARY   REASON FOR ADMISSION:  This is a 65 year old male status post bilateral  hemilaminectomy and decompression at the L3-L5 levels followed by  diskectomy and fusion and bone grafting done on April 23, 2008, at  [facility].  The patient had then been transferred to [facility] for rehabilitation.  He had been discharged on the day  of this admission.  He had gone to a [employer] and while eating  he suddenly had 2 syncopal episodes while sitting.  His blood sugar was  in the 120s at the scene and blood pressure was normal when checked by  EMS.  He was transferred to [facility] for further evaluation.   PHYSICAL EXAMINATION:  VITAL SIGNS:  Temperature 97.5, blood pressure  120/99, heart 88, and respirations 12.  LUNGS:  Clear.  HEART:  Regular rate and rhythm without murmur, gallop, or rub.  ABDOMEN:  Benign.  EXTREMITIES:  No edema.  NEUROLOGIC:  Nonfocal.   LABORATORY:  WBC 7.1, hemoglobin 10.1, platelets 137.  Potassium 4.0,  creatinine 1.0.  Cardiac enzymes were normal.  Chest x-ray showed  cardiomegaly, otherwise no infiltrates.  EKG, no significant change from  2 weeks earlier.  CT scan of the chest showed no evidence of pulmonary  embolus.   HOSPITAL COURSE:  The patient was admitted for workup of the syncopal  episodes.  He was given IV fluids.  His diuretics were held.  Flomax was  discontinued, which can cause low blood pressure.  The patient was  documented to have normal orthostatic vital signs supine, sitting, and  standing on May 26, 2008.  He worked with Physical Therapy  and had  no trouble standing.  His telemetry remained negative.  He was  asymptomatic throughout the hospitalization.   DISCHARGE DIAGNOSES:  1. Syncope.  2. Diabetes mellitus.  3. Lumbar spinal stenosis, recent decompression.  4. Hypertension.  5. Gallstone.  6. Primary sclerosing cholangitis.  7. Ulcerative colitis.  8. History of prostate cancer.  9. Degenerative joint disease, cervical spine.   PROCEDURES:  1. CT scan of the brain negative.  2. CT scan of the chest.   DISCHARGE MEDICATIONS:  1. Insulin Lantus 15 units under skin q.a.m.  2. Metformin 500 mg b.i.d.  3. Actigall 300 mg b.i.d.  4. Aspirin 325 mg daily.  5. Dulcolax tabs 2 twice a day.  6. __________ 1 q.p.m.  7. Iron sulfate 325 mg a day for 1 month only.  8. Hydrochlorothiazide 25 mg 1 tab a day.  9. Lopressor 25 mg one-half twice a day.  10.Diflucan 20 mg q.8 p.r.n.  11.Benazepril 40 mg daily.  12.Metoclopramide 5 mg twice a day for 2 days, 5 mg  daily for 2 days,      and then discontinue.  13.Protonix 40 mg a day.  14.Glucotrol, Flomax, Lasix, and potassium were stopped.   DISPOSITION:  Discharged to home.   FOLLOWUP:  Follow in 1 week with Dr. Valentina Lucks.   DIET:  Diabetic diet.           ______________________________  Thora Lance, [name]     JJG/MEDQ  D:  05/27/2008  T:  05/27/2008  Job:  [id number]

## 2010-11-07 NOTE — Discharge Summary (Signed)
NAME:  Hunter Moss, Hunter Moss NO.:  [id number]   MEDICAL RECORD NO.:  [id number]          PATIENT TYPE:  INP   LOCATION:  1427                         FACILITY:  [facility]   PHYSICIAN:  Theressa Millard, [name]    DATE OF BIRTH:  1945-09-04   DATE OF ADMISSION:  11/29/2006  DATE OF DISCHARGE:  11/30/2006                               DISCHARGE SUMMARY   ADMITTING DIAGNOSIS:  Syncope.   DISCHARGE DIAGNOSES:  1. Syncope probably secondary to hypoglycemia.  2. Diabetes mellitus.  3. Hypertension.  4. Gastroesophageal reflux disease.  5. Prostate cancer.  6. Hypercholesterolemia.  7. Cholelithiasis.  8. 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   The patient is a 65 year old black male, who came in with an episode of  syncope.  He was working outdoors, when he felt weak, clammy, sweaty and  disoriented.  He has had similar episodes in the past, when he has had  hypoglycemia.  He called for family members to bring him something to  eat or drink.  Before they could get there, he passed out.  However, he  awoke a few minutes later.  There were able to get some fruit juice sent  to him, and 5-10 minutes later when the EMTs arrived, his CBG was 117.  He awoke, had no seizure activity, and was brought to the emergency  apartment.   HOSPITAL COURSE:  The patient was evaluated in the emergency room and  thought that it would be appropriate for him to be brought in for  observation overnight.  He came in and had basically negative lab work.  His CK was mildly elevated with a mildly elevated MB, but his troponin  was negative.  I suspect that the CK is secondary to the heat in which  he was working.  He had no other symptoms that would be consistent with  coronary disease.  His CBGs were fine.  I had a long conversation with  the patient and his family, and it was noted that the patient does not  eat regularly through the day.  He tends to eat one meal a day, and this  episode occurred  about 6 hours after that meal.  He is admonished to eat  regularly, in order to avoid similar episodes in the future.   DISCHARGE MEDICATIONS:  1. Metformin 500 mg, two tablets b.i.d.  2. Glipizide ER 10 mg daily.  3. Norvasc 10 mg daily.  4. Protonix 40 mg daily.  5. Benazepril 40 mg daily.  6. Lantus 25 units in the morning and 20 units in the evening.  7. Aspirin 325 mg daily.  8. Hydrochlorothiazide 25 mg daily.  9. Multivitamin daily.  10.Lasix 40 mg, two twice daily.   DIET:  Continue same diet, but he is admonished to eat regularly through  the day.  Activities as tolerated.   FOLLOW UP:  He will keep a scheduled follow-up appointment with Dr.  Valentina Lucks.      Theressa Millard, [name]  Electronically Signed     JO/MEDQ  D:  11/30/2006  T:  11/30/2006  Job:  [id number]

## 2010-11-07 NOTE — Discharge Summary (Signed)
NAME:  Hunter Moss, Hunter Moss NO.:  [id number]   MEDICAL RECORD NO.:  [id number]          PATIENT TYPE:  IPS   LOCATION:  4011                         FACILITY:  [facility]   PHYSICIAN:  Ranelle Oyster, M.D.DATE OF BIRTH:  Apr 10, 1946   DATE OF ADMISSION:  05/07/2008  DATE OF DISCHARGE:  05/25/2008                               DISCHARGE SUMMARY   DISCHARGE DIAGNOSES:  1. L3-S1 stenosis with myelopathy.  2. Neurogenic bowel and bladder with resolution of urinary retention.  3. Ileus, resolved.  4. Diabetes mellitus, type 2, better controlled.  5. Hypertension.  6. Spasticity, improved.   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  The patient was taken to OR at [facility] on April 23, 2008, for bilateral hemilaminectomy for decompression at L3-L4 and L4-L5  as well as decompression at L5-S1 with diskectomy and fusion with bone  graft by Dr. Wyline Mood.  Postop, the patient had issues with ileus,  requiring placement of NG tube.  This was discontinued a few days ago  and diet initiated.  The patient has been tolerating p.o.  Therapies  were initiated, and he is noted to have problems with mobility and self-  care.  Rehab was consulted for further therapy.   Past medical history is significant for:  1. DM, type 2.  2. Hypertension.  3. Right total hip replacement, October 2003.  4. GERD.  5. Gallstones status post ERCP in June 2003 and chronic ursodiol      treatment.  6. Ulcerative colitis.  7. Primary sclerosing cholangitis.  8. Prostate cancer, treated with XRT.  9. 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  10.History of abnormal LFTs in the past.   ALLERGIES:  No known drug  allergies.   FAMILY HISTORY:  Positive for colon cancer, coronary artery disease, and  diabetes mellitus.   SOCIAL HISTORY:  The patient is married, lives in 1-level home with 5  steps at entry.  Does not use any tobacco or alcohol.   FUNCTIONAL HISTORY:  The patient ambulated with use of 2 canes.  For  short distance, he used electric wheelchair and works full time.   FUNCTIONAL STATUS:  The patient is min assist with increased time to get  to the edge of the bed, +2 max assist to transfer with each knee block.  He is able to stand with knees noted to be flexed and tendency for knees  to buckle without warning.  He is currently requiring assistance for  ADLs.   PHYSICAL EXAMINATION:  GENERAL:  The patient is well-nourished, well-  developed male, in no acute distress.  HEENT:  Atraumatic and normocephalic.  Extraocular movements intact.  Nares patent.  Moist oral mucosa.  Dentition good to fair.  LUNGS:  Clear to auscultation bilaterally without wheezes or rales.  HEART:  Tachy,  however, regular rhythm without murmurs.  ABDOMEN:  Round, slightly tight with high-pitched bowel sounds,  nontender.  SKIN:  Scabs on bilateral knees.  Dry skin noted on bilateral lower  extremities.  Back incision is intact, clean, and dry and no evidence of  breakdown.  NEUROLOGIC:  The patient is alert and oriented x3, follows commands  without difficulties.  Moves bilateral upper extremities without  difficulty.  Shows decreased sensation throughout lower abdomen into  thighs, to lower extremities.  Reflexes are 1+ at bilateral knees and  ankles.  Right lower extremity strength generally 4/5, left lower  extremity strength 3-/5 at hip and knee flexion and knee extension.  Poor dorsiflexion on left with tight heel cords.   HOSPITAL COURSE:  Hunter Moss is a 65 year old male admitted to  rehab on May 07, 2008, for inpatient therapies to consist of PT/OT  at least 3 hours 5 days a week.   Past admission, physiatry, 24-hour  rehab RN, and therapy team have worked together to provide customized  collaborative interdisciplinary care.  They have been assessing the  patient's progress with weekly conferences as well as discussing  barriers to discharge and setting goals.  Rehab RN has been assisting  with bowel and bladder program as well as family education to the  patient and wife regarding bowel training.  She has been assisting with  skin care and wound care monitoring.  They have also been assisting with  diabetic monitoring and encouraging the patient's p.o. intake to avoid  issues with hypoglycemia.  At the time of admission, the patient was  initially noted to have issues with variable p.o. intake with blood  sugars tending to drop in 40s.  His diabetic regimen has been adjusted  to prevent these drops.  At the time of discharge, the patient's blood  sugars much improved overall with blood sugars running from 80s to  occasional high in 150s.  Blood pressures have been checked on b.i.d.  basis during this stay.  These have been well controlled ranging from  low 100s-130s systolic, 60s-80s diastolic.  The patient was noted to  have issues with tachycardia initially in part due to deconditioning  with heart rates going up to 120s.  He did not report any chest pain or  complaints of any palpitations.  He was started on low-dose beta-blocker  and has been tolerating this without difficulty.  Currently, the  patient's heart rate continues in 80s to low 100 range.   Labs were done past admission revealing hemoglobin 9.1, hematocrit 27.7,  white count 8.9, platelets 472.  Check of lytes revealed sodium 138,  potassium 4.2, chloride 101, CO2 of 32, BUN 16, creatinine 0.82, glucose  158.  The patient was maintained on iron supplements for his acute blood  loss anemia.  Routine check of labs have been done during this stay.  Last check of CBC of May 18, 2008, reveals  hemoglobin 9.8,  hematocrit 30.2, white count 5.8, platelets 360.  Check of LFTs at  admission revealed AST 19, ALT 17, alkaline phosphatase 150, total  bilirubin 0.4, albumin 3.4.   The patient was noted to have issues with tone in left lower extremity.  He was noted to have difficulty fully extending left lower extremity out  especially in a.m.  He was maintained on Flexeril with baclofen added to  help with tightness.  However initially, the patient was unable to  tolerate baclofen.  Flexeril was discontinued, and BP meds were adjusted  with hydrochlorothiazide being decreased to 12.5 mg a day, and Norvasc  has been discontinued.  Currently, baclofen has slowly been increased to  20 mg t.i.d.  The patient is currently tolerating this without any  recurrence of orthostasis.  The patient was noted to have Klebsiella UTI  at the time of admission.  He was maintained on Cipro for 7 total days  of antibiotic therapy.  Also secondary to his history of ileus as well  as abdominal distention, the patient was maintained on Reglan initially.  He was noted to have issues with constipation due to neurogenic bladder  and was started on an aggressive bowel program.  A KUB was done for  followup, and this showed nonspecific bowel gas pattern.  Bowel program  has been adjusted to Dulcolax tabs 2 p.o. b.i.d. with mini enema being  done q.a.m. to help for evacuation of bowel, and this has been very  effective without any issues with incontinence.  As the patient's  mobility was noted to be improving, Foley was discontinued on May 13, 2008, and bladder training was initiated.  Initially, the patient  was noted to have problems voiding with volumes at 160-700 cc.  The  patient was started on Flomax 0.4 mg nightly.  Additionally, Urecholine  was added and increased to 25 mg t.i.d. with improvement in the  patient's voiding.  PVRs were noted to drop down to 130-230s.  By the  time of discharge, the  patient was voiding without difficulty, and he  has been weaned off of Urecholine.  He is to continue on Flomax for now.   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  With addition of baclofen, he  has shown improvement in movement of left lower extremity with ability  to currently work through a tightness of left heel.  A PRAFO was ordered  to use for left lower extremity, and he is to use this whenever in bed.  OT evaluation revealed the patient at mod assist to sit at the edge of  the bed, total assist 50% for bed to wheelchair transfers.  He required  max assist with the left lower extremity mobilization.  ADL training has  been focusing on lateral leans in wheelchair with cuing for back  precautions.  The patient was noted to have difficulty donning clothing  on left lower extremity.  Wife and the patient were instructed regarding  DME use as this could help improve the patient's toileting and showering  function.  They were agreeable to this.  Wife has been very supportive  and has been there for multiple sessions to do hands-on training with OT  as well as PT.  The patient is currently at supervision level for ADLs.  He is able to perform toileting with supervision level.  He is able to  don and doff pants and perform hygiene with supervision level with use  of assistive equipment.  The patient has also been given information  regarding using a foot funnel to help assist donning shoe.  PT  evaluation at admission revealed the patient had +2 total assist for  wheelchair to toilet transfers.  He was able to perform scoot/squat  transfers at 25% effort.  He will do transfer from toilet to wheelchair  with [place] Dec Plus.  He was noted have difficulty adjusting his left  foot  position and was noted to have problems positioning himself and   maintaining his hip precautions.  PT has been working with the patient  in challenging the patient with balance as well as using new step for  lower extremity strengthening.  PT has also been working with multiple  dynamic sitting balance activity to challenge the patient in various  direction and challenge the patient to make consecutive corrective  catches, tosses with activities to challenge his balance.  Currently,  the patient is at supervision level for bed-to-chair transfers with and  without sliding board.  Both were done with wife providing supervision.  The patient is able to perform bed mobility using leg loops and  supervision.  He is able to stand in standing frame and maintained  bilateral lower extremity strength conditioning with increased  weightbearing and increased manual muscle control of left lower  extremity.  Secondary to lower extremity weakness, he is unable to  independently stand or ambulate without mechanical lift device.  He has  been able to ambulate 10-20 feet with mechanical lift device.  He was  unable to tolerate any further due to issues with fatigue and decreased  endurance.  The patient's wife is very supportive.  She will provide 24-  hour supervision and assistance as needed past discharge.  The patient  will additionally receive progressive PT/OT by [facility] past  discharge.  Home health RN has been also been arranged for followup past  discharge.  Rehab RN has gone through a bowel and bladder training with  the patient's wife.  It has been reiterated both with the patient and  wife regarding need for using Dulcolax tabs on b.i.d. basis to help with  GI function as well as mini enema to help evacuate bowel efficiently and  to prevent any recurrence of ileus.  The patient will be tapered off  Reglan over the next few days.  Also discussed with the patient and wife  issues regarding recurrence of urinary retention if constipation tends  to be  an issue.  Both the patient and wife are agreeable to stick with  bowel program for now.  They are to call with any further questions as  these occur.  On May 25, 2008, the patient is discharged to home.   DISCHARGE MEDICATIONS:  1. Actigall 300 mg b.i.d.  2. Coated aspirin 325 mg a day.  3. Dulcolax tabs 2 p.o. b.i.d.  4. Mini enema 1 q.p.m.  5. Ferrous sulfate 325 mg b.i.d.  6. Flomax 0.4 mg nightly.  7. Glucophage 500 mg b.i.d.  8. Glucotrol XL 10 mg 1-1/2 p.o. q.a.m.  9. Hydrochlorothiazide 25 mg half per day.  10.K-Dur 20 mEq a day.  11.Lasix 40 mg a day.  12.Lantus insulin 20 units in a.m.  13.Lopressor 25 mg half p.o. b.i.d.  14.Baclofen 20 mg 1 q.8 h.  15.Lotensin 40 mg a day.  16.Reglan 5 mg t.i.d. a.c. x2 days, then decrease to 1 p.o. b.i.d. x2      days, then 1 per day till gone.  17.Protonix 40 mg a day.   DIET:  Diabetic diet, low salt.   SPECIAL INSTRUCTIONS:  Routine back precautions.  Do not use Norvasc for  now.  Keep back incision clean and dry.   ACTIVITY LEVEL:  No alcohol, no smoking, no driving, no strenuous  activity.  A 24 hours supervision assist.  [facility] to provide  PT, OT, and RN.   FOLLOWUP:  The patient is to follow up with Dr. Riley Kill on June 21, 2008, at 2:40.  Follow up with Dr. Wyline Mood for postop check in the next 1-  2 weeks.  Follow up with Dr. Valentina Lucks for routine check in 2 weeks.      Greg Cutter, P.A.      Ranelle Oyster, [name]  Electronically Signed    PP/MEDQ  D:  05/25/2008  T:  05/26/2008  Job:  [id number]   cc:   Cecille Amsterdam, [name]

## 2010-11-07 NOTE — Assessment & Plan Note (Signed)
[place] is back regarding his lumbar stenosis with myelopathy.  He states  that he saw his surgeon and his hardware is come apart in his back.  He  brought me x-rays to prove this today.  They were actually printed  copies of an x-ray.  He was back to see the surgeon next month.  Apparently he has been told that the loosening was due to therapy.  He  denies pain.  He has some spasms occasionally in left heel and foot.  He  started back on his baclofen and had seizure activities, so he has not  gone back on this.  Spasms sometimes interfere with hygiene and  transfers as well as sleep at night.  Bowel and bladder function has  been within normal limits.  He feels that he has had ongoing weakness in  the left leg more than right.  Mood has been poor as he reports  depression.   REVIEW OF SYSTEMS:  Notable for the above as well as trouble walking.  He is in his power wheelchair today.  He denies any other issues of  breathing, shortness of breath, chest pain, etc.  Full review is in the  written health and history section.   SOCIAL HISTORY:  The patient is married, living with his wife, who is  with him today and taking notes.   PHYSICAL EXAMINATION:  VITAL SIGNS:  Blood pressure is 161/86, pulse is  92, respiratory rate 18, and he is sating 96% on room air.  GENERAL:  The patient is flat, alert.  He is oriented x3.  MUSCULOSKELETAL:  Strength is 5/5 in the upper extremities.  Left lower  extremity is 1-2/5.  Right lower extremity is 2+-3/5.  Sensation is 3/2  in both legs.  He has tone in his left hip and knee as well as ankle  today at 1-2/4 and trace edema in both ankles.  Reflexes are decreased  in right lower extremity.  He has 2+ reflex at the knee and ankle on the  left.  HEART:  Regular.  CHEST:  Clear.  ABDOMEN:  Soft and nontender.  Weight was unchanged.   ASSESSMENT:  1. Lumbosacral stenosis with myelopathy.  The patient with hardware      movement in the spine.  2.  Neurogenic bowel and bladder.  3. Hypertension.  4. Spasticity in left greater than right lower extremity.   PLAN:  1. Recommended trial of Zanaflex for spasm p.r.n.  The patient was      hesitant to do this as he had problems with baclofen before.  2. Hold off on activity for low back and legs until followup with      Surgery.  He is still deciding on what course he      wants to take there.  3. I will see the patient back in approximately 2-3 months depending      on his course with surgery.      Ranelle Oyster, [name]  Electronically Signed     ZTS/MedQ  D:  08/09/2008 13:50:36  T:  08/10/2008 02:03:27  Job #:  [id number]   cc:   Thora Lance, [name]  Fax: [phone]   Donette Larry  Fax: [phone]

## 2010-11-07 NOTE — Assessment & Plan Note (Signed)
Hunter Moss is back regarding his lumbar stenosis with myelopathy and  neurogenic bowel and bladder.  He is back today in followup after rehab  stay.  He had been receiving Home Health PT.  He is doing independent  transfers now.  He had a problem with fall from his bed while  transferring to the chair as the chair rolled away from him.  He has  been standing with therapy.  I think 15-20 minutes time working on  balance and weight shifting.  He denies pain delay.  His moves generally  been improved for the most part except for today.  Reports occasional  spasms.  He has been taking his baclofen only p.r.n.  As of weight, he  has been taking it in the morning only.  He is on MiraLax daily for his  bowels for the last 2 days and that seems to be working fairly well.  Day after he went home, he had an episode dose where he had a near  syncope and was sent back to the hospital and found to be slightly  dry.   REVIEW OF SYSTEMS:  Notable for occasional high sugars.  Other pertinent  positives are above and full review is in the written health and history  section of the chart.   SOCIAL HISTORY:  The patient is married, living with his wife, who is  with him today.   PHYSICAL EXAMINATION:  VITAL SIGNS:  Blood pressure is 166/86, pulse 77,  and respiratory rate 18.  He is saturating 99% on room air.  GENERAL:  The patient is pleasant, alert, and oriented x3.  He is a bit  flatter than usually for the most part.  His strength is generally 5/5  in the upper extremities.  Lower extremity strength is 2/5, right lower  extremity 2/5, proximal left lower extremity 2+/5 distally.  He reports  intact 2/2 strength in both legs.  There are no appreciable tone at the  hips, knees, or ankles today.  EXTREMITIES:  Trace edema at both ankles.  Reflexes are traced absent in  the lower extremities and 1+ in the upper extremities today.  HEART:  Regular.  CHEST:  Clear.  ABDOMEN:  Soft, nontender.  Weight was  stable.   ASSESSMENT:  1. Lumbosacral stenosis with myelopathy.  2. Check bowel and bladder.  3. Hypertension.  4. Spasticity.   PLAN:  1. I gave the patient permission to use baclofen p.r.n. for tone as      they seemed to be very loose today.  I want to inhibit his      voluntary movement intact.  2. We will transition him from Home Health Therapy to Outpatient PT      and OT to work on specialized equipments including LiteGait,      parallel bars, perhaps some electrical stimulation, etc.  He may      actually be a candidate for aquatic therapies.  3. MiraLax for bowel.  This seems to be working fairly well for him at      the moment.  4. Other medications for blood pressure and diabetes per his family      physician.  5. I will see him back in about 2 months' time.  He is to call me with      any problems or questions.  Overall, he has made some excellent      progress.      Ranelle Oyster, [name]  Electronically Signed  ZTS/MedQ  D:  06/21/2008 15:05:04  T:  06/22/2008 08:31:40  Job #:  [id number]   cc:   Thora Lance, [name]  Fax: [phone]   Donette Larry  Fax: [phone]

## 2010-11-07 NOTE — H&P (Signed)
NAME:  Hunter Moss, Hunter Moss NO.:  [id number]   MEDICAL RECORD NO.:  [id number]          PATIENT TYPE:  IPS   LOCATION:  4011                         FACILITY:  [facility]   PHYSICIAN:  Ellwood Dense, [name]   DATE OF BIRTH:  [birth year]   DATE OF ADMISSION:  05/07/2008  DATE OF DISCHARGE:                              HISTORY & PHYSICAL   PRIMARY CARE PHYSICIAN:  [name], [name]   GI:  Danise Edge, [name]   UROLOGIST:  Lindaann Slough, [name]   HISTORY OF PRESENT ILLNESS:  [name] is a 65 year old African  American male with history of diabetes mellitus and hypertension, along  with left foot drop.  He has developed gradual inability to ambulate  secondary to especially, left lower extremity weakness.  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  The  patient was taken to the operating room April 23, 2008 at [facility] for bilateral hemilaminectomy and  decompression, L3-4 and L4-5 with decompression L5-S1 and diskectomy and  fusion with bone grafting done by Dr. Wyline Mood.  Postoperatively he  developed ileus with a nasogastric tube placed which was subsequently  discontinued.  His diet has been advanced as tolerated.  Therapies have  been initiated with the patient showing problems with mobility with  inability to stand without having his knees blocked.   The patient was evaluated by the rehabilitation physicians and felt to  be an appropriate candidate for inpatient rehabilitation.   REVIEW OF SYSTEMS:  Positive for incontinence of bowel and bladder,  wound healing, lumbago, numbness and weakness especially, involving his  left leg.   PAST MEDICAL HISTORY:  1. Type 2 diabetes mellitus.  2. Hypertension.  3. Right total hip replacement, October, 2003.  4. Gastroesophageal reflux disease.  5. History  of gallstones, status post ERCP, June, 2003 with diagnosis      of primary sclerosing cholangitis.  6. Ulcerative colitis.  7. Prostatic cancer with history of radiation seed treatment.  8. Degenerative disk disease of the cervical spine.  9. Bilateral lower extremity numbness with gait problems and weakness      especially, of the left leg.  10.History of abnormal liver function tests.   FAMILY HISTORY:  Positive for colon cancer, coronary artery disease and  diabetes mellitus.   SOCIAL HISTORY:  The patient is married and lives in one level home with  five steps to enter.  He does not use alcohol or tobacco.  He has  numerous assistive devices including canes, walkers, wheelchairs and  power wheelchair.   FUNCTIONAL HISTORY PRIOR TO ADMISSION:  The patient used two canes to  ambulate and also used the power wheelchair for mobility.  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   ALLERGIES:  No known drug allergies.   MEDICATIONS PRIOR TO ADMISSION:  1. Metformin 1000 mg b.i.d.  2. Norvasc 10 mg daily.  3. Glucotrol XL 10 mg b.i.d.  4. Lasix 40  mg daily.  5. Lantus insulin 30 units q.a.m. and 20 units q.p.m.  6. Ursodiol 500 mg t.i.d.  7. Aspirin 325 mg daily.  8. Protonix 40 mg daily.  9. K-Dur 20 mEq daily.   LABORATORY:  Most recent hemoglobin was 8.4 with hematocrit of 25.6,  platelet count 363,000 and white count of 11.2.  Recent sodium was 138,  potassium 3.6, chloride 98, bicarbonate 31, BUN 11, creatinine 0.8 and  glucose of 198.  Recent magnesium was 2.  Recent prealbumin was 8.8.   PHYSICAL EXAMINATION:  A reasonably well-appearing large adult male  lying in bed in mild to no acute discomfort.  Blood pressure 120/76 with pulse of 103, respiratory rate 17 and  temperature 99.2 with weight of 103.9 kg and height 5 feet, 9 inches.  HEENT:  Normocephalic, nontraumatic.  CARDIOVASCULAR:  Regular rate and rhythm.  S1 and S2 without murmurs.  ABDOMEN:  Soft, obese, nontender with slight high-pitched bowel sounds.  LUNGS:  Clear to auscultation bilaterally.  NEUROLOGIC:  Alert and oriented x3.  Cranial nerves II-XII are intact.  BILATERAL UPPER EXTREMITY EXAM:  Showed 4+/5 strength throughout.  Bulk  and tone were normal.  The patient complains of only mild tingling in  his bilateral upper extremities, left greater than right.  LOWER EXTREMITY EXAM:  Showed decreased sensation throughout the lower  abdomen and into his thighs and lower extremities.  Reflexes were 1+ of  the bilateral knees and ankles.  Right lower extremity strength was generally of 4/5 and left lower  extremity strength was generally 3-/5 in hip flexion and knee extension.  The patient has poor ankle dorsiflexion on the left.   DIAGNOSES:  1. Status post L1-3 bilateral laminectomy/fusion by Dr. Wyline Mood at      [facility], April 23, 2008.  2. Klebsiella urinary tract infection presently on ciprofloxacin      through May 11, 2008.  3. Insulin-dependent diabetes mellitus presently on Lantus insulin 30      units in the morning and 20 units in the evening along with      metformin 500 mg b.i.d. and glipizide XL 10 mg b.i.d.  4. Hypertension, presently on Lasix 40 mg daily and benazepril 40 mg      daily along with Norvasc 10 mg daily.  5. Subcutaneous Lovenox for deep vein thrombosis prophylaxis.  6. History of recent gastrointestinal ileus with continuation of      Reglan 10 mg p.o. a.c. and evening and Dulcolax suppository one      daily p.r.n. if no bowel movement along with Colace 100 mg p.o.      b.i.d.  7. Postoperative anemia with continuation of multivitamin daily with      addition of iron sulfate 325 mg p.o. b.i.d. with check a CBC today      with start of 2 units transfusion if hemoglobin less than 8.4  8. Routine back precautions.  9. Postoperative pain presently treated with Vicodin 5/500 one to two      tablets p.o. q.4h. p.r.n.  for pain.   1. Presently, the patient will be admitted to receive collaborative      and interdisciplinary care between the physiatrist, rehab nursing      staff and therapy team, at least 3 hours per day at least 5 days      per week.  The patient's level of medical complexity and      substantial therapy needs in context of that medical  necessity      cannot be provided at lesser intensity of care.  Physiatrist will      provide 24-hour management of medical needs, as well as oversight      of the therapy plan/treatment and provide guidance as appropriate      regarding interaction of the two.  The 24-hour rehab nursing will      assist in management of bowel and bladder and help integrate      therapy concepts, techniques and education.  Physical therapy will      assess and treat for range of motion, strengthening of bed      mobility, transfers, pre-gait training, gait training, equipment      eval.  Mixed occupational therapy will assess and treat for range      of motion, strengthening, ADLs, cognitive/perceptional training,      splinting and equipment eval.  2. Team conferences will be held weekly to establish goals, assess      progress and determine barriers to discharge.  3. Case management and social work will assess and treat for      psychosocial issues and discharge planning as appropriate.   PROGNOSIS:  Good.   ESTIMATED LENGTH OF STAY:  15-25 days.   GOALS:  Min assist for transfers and min assist ambulation short  distances with total modified independent power wheelchair use and  standby assist to modified independent manual wheelchair use with  modified independent ADLs.           ______________________________  Ellwood Dense, [name]     DC/MEDQ  D:  05/07/2008  T:  05/07/2008  Job:  [id number]

## 2010-11-07 NOTE — Assessment & Plan Note (Signed)
Hunter Moss is back regarding his lumbosacral stenosis and myelopathy.  He  had good results with the Botox injections.  Unfortunately, he has had 3  falls from his chair over the last month and has had some injury to the  left knee.  He feels that he has not been able to be stable on his leg  as a result.  He has done some walking and standing at home and knee has  given away at times.  We have discussed bracing.  He seems to have been  a bit hesitant to do so.  He has completed physical therapy at this  point at [facility].  He has not been as active with his exercises at home.   REVIEW OF SYSTEMS:  Notable for the above.  Full 14-point review is in  the written health and history section of the chart.   SOCIAL HISTORY:  The patient is married with his wife.   PHYSICAL EXAMINATION:  Blood pressure is 161/84, pulse is 92,  respiratory rate is 20, sating 96% on room air.  The patient is  pleasant, alert and oriented x3.  He continues to have improved strength  in the left lower extremity with 2/5 to 2+/5 strength at hip flexion and  extension, 3+ to 4/5 at the knee, 2/5 ankle plantar flexion, and trace  to 1/5 ankle dorsiflexion still.  Right lower extremity is 3+ to 4/5.  He had good passive movement of all joints today.  He had bruising and  contusions to the knee with multiple scrapes and scars on the left shin  as well.  Some mild infrapatellar swelling noted.  He had minimal pain  with passive and active movement today.  Heart is regular.  Chest is  clear.  Abdomen is soft, nontender.  Sensory exam remains decreased to  pinprick and light touch, proprioception 1/2.  Reflexes are 1+ to 2+ in  both lower extremities.  Weight is stable.  Mood is generally  appropriate, although remains a bit flat.   ASSESSMENT:  1. 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  2. Lower extremity spasticity, left greater than right.  3. Hypertension.  4. Recent left knee contusion.   PLAN:  1. I would like to get [place] transition to outpatient aquatics      therapy.  We will [facility] to [facility].  2. Ordered left AFO, likely a double upright metal type for leg      stabilization.  He may need to KAFO as well.  We will see how he      does first with the ankle brace.  3. Recommended some ice and gentle stretching and strengthening      exercises in left lower extremity for now.  No specific      interventions needed at this point in my opinion.  He does need to      practice better safety judgment decision making as a whole.  4. I will see him back in about 2 months' time.      Ranelle Oyster, [name]  Electronically Signed     ZTS/MedQ  D:  01/17/2009 12:27:32  T:  01/18/2009 02:14:30  Job #:  [id number]   cc:   Thora Lance, [name]  Fax: [phone]

## 2010-11-07 NOTE — H&P (Signed)
NAME:  Hunter Moss, Hunter Moss NO.:  [id number]   MEDICAL RECORD NO.:  [id number]          PATIENT TYPE:  INP   LOCATION:  1824                         FACILITY:  [facility]   PHYSICIAN:  Michiel Cowboy, MDDATE OF BIRTH:  [birth year]   DATE OF ADMISSION:  05/25/2008  DATE OF DISCHARGE:                              HISTORY & PHYSICAL   PRIMARY CARE Anahla Bevis:  Thora Lance, [name]   CHIEF COMPLAINT:  Syncope and collapse.   The patient is a 65 year old gentleman with history of profound stenosis  at the L3-L4 and L4-L5 level, status post bilateral hemilaminectomy and  decompression at those levels; and diskectomy with fusion and bone  grafting done by Dr. Donette Larry on April 23, 2008 at [facility].  Since then, the patient was transferred to [facility] rehab  inpatient rehab facility for further rehabilitation.  The patient was  doing actually fairly well and was discharged to home today.  After his  discharge the patient and his wife went to [employer] to have dinner.  In the middle of dinner he was sitting and eating, when all a sudden he  stated he was not feeling well.  He stated that he felt that his blood  sugar dropped.  His wife reached over to give sugar, but he passed out.  EMS was called; they came out.  His blood sugar was 120s and his blood  pressure was normal.  The patient quickly recovered and he refused EMS  ride.  EMS started to leave, when he had another episode of syncope.  Again, no seizure-like activity was noted.  His eyes appear rolled in  his head and was unresponsive.  This resolved briefly, at which point  they insisted that he needs to be further evaluated.  They placed him on  a gurney, at which point he became completely unresponsive for a few  minutes; unresponsive to sternal rub.  I am unsure of his vitals at that  time, but this again resolved.  By the time he presented to the  emergency department he was normotensive,  with a glucose of 118.  At  which point, [facility] were called.  Of note, he had a cardiac  workup at [facility], he stated in July 2009, which was completely  unremarkable.  The physician told him that he had a heart of a 25-year-  old.   On chest x-ray here it seems that he has some degree of cardiomegaly  nonetheless.  Otherwise review of systems negative; except for chills  that he is experiencing currently, otherwise unremarkable.  No fevers,  no nausea, no vomiting.  No diarrhea.  He has been somewhat constipated,  for which he takes medications.  He has a foot drop on the right, which  is persistent.  He is wheelchair-bound, which is unchanged.  Otherwise,  no chest pain or shortness of breath. Otherwise unremarkable.  Of note,  during this episode he was profusely diaphoretic, per his wife.   PAST MEDICAL HISTORY:  1. History of diabetes.  2. History of recent decompression of L3-4 and  L4-5 with diskectomy;      secondary to spinal canal stenosis.  3. Hypertension.  4. Total right hip replacement, October 2003.  5. History of gallstones, status post [facility] June 2003.  6. History of primary sclerosing cholangitis.  7. Ulcerative colitis.  8. History of prostate cancer, with radiation seed treatment.  9. Degenerative disk disease of cervical spine.  10.Bilateral lower extremity numbness and gait problems; with weakness      of the left leg and a foot drop on the right.  11.History of normal liver function tests.   FAMILY HISTORY:  Significant for colon cancer, coronary artery disease  and diabetes.   SOCIAL HISTORY:  The patient never smoked or drank.  Lives at home with  his wife.  He uses a wheelchair.   ALLERGIES:  NO KNOWN DRUG ALLERGIES.   MEDICATIONS:  He was discharged on:  1. Lantus 20 units q.a.m.  2. Glucophage 500 mg twice a day.  3. Glucotrol 50 mg once a day.  4. Hydrochlorothiazide 12.5 mg once a day.  5. Lopressor 4.5 mg twice a day.  6. Actigall  300 mg twice a day.  7. Actos 325 mg once a day.  8. Dulcolax four tablets twice a day.  9. Enema once a day.  10.Ferrous sulfate 325 mg twice a day.  11.Flomax 0.4 mg at bedtime.  12.K-Dur 20 mEq daily.  13.Lasix 40 mg daily.  14.Baclofen 20 mg every 8 hours as needed.  15.Lotensin 40 mg once a day.  16.Protonix once a day.  17.Reglan 5 mg, which we will need to decrease the dose thereafter.   PHYSICAL EXAMINATION:  VITALS:  Temperature 97.5, blood pressure 120/99,  pulse 88, respirations 12, saturations 100% on room air.  The patient  appears to be somewhat somnolent, but easily arousable; but then drifts  back to sleep.  HEENT:  HEAD -- Nontraumatic.  Somewhat dryish mucous membranes, but  normal skin turgor.  LUNGS:  Clear to auscultation bilaterally.  ABDOMEN:  Obese, but nontender and nondistended.  Normal bowel sounds.  EXTREMITIES:  Lower extremities without clubbing, cyanosis or edema.  HEART:  Regular rate and rhythm.  No murmurs appreciated.  NEUROLOGIC:  Cranial nerves intact.  Strength 5/5 in upper extremities,  and 3/5 on the lower extremities -- with the right leg worse than left.   LABS:  White blood cell count 7.1, hemoglobin 10.1.  Sodium 137,  potassium 4.0, creatinine 1.0.  Cardiac enzymes within normal limits.  Hemoccult negative.  Chest x-ray showing cardiomegaly but otherwise no  infiltrate.  EKG showing early repolarization in leads II, III, IV and  V;  which per review of EKG from May 12, 2008 is the same with no  significant change from EKG of May 12, 2008.  The  Q-waves noted in  the lead III, has been there before.  Otherwise, he is showing normal  sinus rhythm.   ASSESSMENT AND PLAN:  This is a 65 year old gentleman with:  1. History of ulcerative colitis.  2. Benign prostatic hypertrophy.  3. Diabetes.  4. Hypertension.  5. Cardiomegaly on chest x-ray.   He presents with syncope.  1. Syncope:  The diagnosis is somewhat broad.  Initial  normal blood      sugar and blood pressure is somewhat reassuring.  Given that the      patient had recurrent episodes, careful monitoring is important.      We will monitor on telemetry, since he is currently hemodynamically  stable.  We will watch for development of SIRS; although currently      does not appear to be infected.  Given the cardiomegaly, will      obtain a 2-D echocardiogram.  Check orthostatics if able.  Check      carotid Dopplers, serial EKGs and cycle cardiac enzymes.   Given the current sleepy state, question if the patient may have a  seizure -- which was somewhat atypical.  Of note, he also became urine  incontinent during this; although there was no jerking-like motions.  We  will, nonetheless, obtain CT of the head without contrast to rule out  bleed or major abnormalities.  We will also obtain a D-dimer.  The  patient was on Lovenox while at the rehab facility.  1. History of diabetes:  Decrease dose of Lantus, in case he had a      transient hypoglycemic event.  Will hold Glucophage and Glucotrol.      Will continue sliding scale insulin.  2. History of hypertension:  Will do holding parameters.  Continue      Lopressor and Lotensin.  3. History of spinal canal stenosis.  4. Deconditioning:  Will get physical therapy and occupational the      patient evaluation.  5. History of constipation:  Continue home medications.  6. Anemia:  Per records since surgery.  Will continue to follow.  Will      check anemia panel.  Continue ferrous sulfate.  Follow hemoccult.   Prophylaxis:  Protonix and Lovenox.  His CT scan was negative.   CODE STATUS:  The patient wished to be DNR/DNI, which was discussed with  him and his family.      Michiel Cowboy, MD  Electronically Signed     AVD/MEDQ  D:  05/25/2008  T:  05/25/2008  Job:  [id number]   cc:   Thora Lance, [name]

## 2010-11-07 NOTE — Op Note (Signed)
NAME:  Hunter Moss, Hunter Moss NO.:  [id number]   MEDICAL RECORD NO.:  [id number]          PATIENT TYPE:  AMB   LOCATION:  ENDO                         [facility]AC[facility]   PHYSICIAN:  Petra Kuba, [name]    DATE OF BIRTH:  1946/05/22   DATE OF PROCEDURE:  01/01/2008  DATE OF DISCHARGE:                               OPERATIVE REPORT   PROCEDURE:  1. Endoscopic retrograde cholangiopancreatography.  2. Sphincterotomy brushing.  3. Balloon dilatation.  4. Balloon pull-through.   INDICATIONS:  Patient with sclerosing cholangitis abnormal MRCP  worrisome for CBD stones.  Consent was signed after risks, benefits,  methods, options thoroughly discussed with both the patient and his  wife.   MEDICINES USED:  Per anesthesia, done with general.   DESCRIPTION OF PROCEDURE:  The side-viewing therapeutic video  duodenoscope was inserted by indirect vision into the stomach and  advanced into the duodenum and a normal-appearing ampulla was brought  into view.  Using the triple-lumen sphincterotome loaded with the Jag  wire, initially the wire was advanced towards the pancreas x2.  The  sphincterotome was repositioned, and deep selective cannulation was  obtained using the Jag wire.  Dye filled the CBD.  He seemed to have 2  strictures one distally about 3-4 cm, a slightly dilated mid duct  segment, and then a stricture in the proximal duct extending a little  bit into the bifurcation which was shorter about 2-3 cm.  No obvious  stones were seen.  We went ahead and secured the wire in the  intrahepatic and proceeded with a medium-sized sphincterotomy in the  customary fashion until we had adequate biliary drainage, and was able  to get the fully bowed sphincterotome in-and-out of the duct.   We then went ahead and exchanged 4 different brushes with the  sphincterotome, first brushing the proximal stricture and putting it in  one container, and then the distal stricture with a  different brush.  We  then went ahead and dilated both strictures using the 4-cm long 8-mm  dilating balloons.  We initially dilated the upper stricture once, and  the lower stricture based on its length, twice; on one of the dilations  the balloon was seen coming through the ampulla.  We did try to overlap  the balloons to make sure we had adequate dilation.  We injected dye at  this juncture, and thought possibly there was still some proximal  stricture remaining, and went ahead and dilated that a little higher.  Balloons, all 4 of them, at times we inflated the balloon, and it was  held for 1 minute.   We then exchanged the balloon dilator for the stone balloon adjustable  12-15 mm, inflated at the bifurcation at 12, and proceed with three  balloon pull through without resistance.  On the first two a little  debris and possibly a little sludge was seen, but nothing on the third.  No frank stones were seen.   We then went ahead and proceeded with about three occlusion  cholangiograms, based on the sphincterotomy, it was hard to keep  all of  the air out of the system.  The strictures seemed to be dilated  adequately.  The balloon, during the occlusion, passed readily; and,  again, no obvious stones were seen.  The intrahepatics were slightly  abnormal as to be expected and sclerosing cholangitis, but no other  frank abnormality was seen.  After multiple balloon pull through and  occlusion cholangiograms, the balloon passed readily through the patent  sphincterotomy site.  There was adequate biliary drainage, and we  elected to stop the procedure at this junction.  The scope was removed.  The patient tolerated the procedure well.  There was no obvious  immediate complication.   ENDOSCOPIC DIAGNOSES:  1. Normal ampulla.  2. Two wire advancements towards the pancreas, but no injections      there.  3. Deep selective cannulation with two strictures seen; one in the      distal duct  about 3-4 cm with the mid duct slight dilated area, and      a stricture just above that about 2-3 cm continuing into the      bifurcation.  4. Status post medium sphincterotomy and brushing both strictures.  5. Balloon dilatation x4 using the 4 cm x 8 mm balloon, held times 1      minute.  6. A 12 x 15 adjustable stone balloon pull-through x6 with minimal      debris and sludge on the first two.  Negative occlusion      cholangiograms x3 with nice resolution of the stricture as planned.      Await pathology.  Hold aspirin for 3 days see how he does, watch      for signs of complications.  Recheck liver tests in 1 week, and      call me sooner p.r.n.  Would leave to Dr. Rema Jasmine whether we felt he      needed a laparoscopic cholecystectomy for his known gallstones,      prior to his back surgery.  Otherwise further workup and plans per      Dr. Rema Jasmine and Dr. Valentina Lucks.  Happy to see back p.r.n.           ______________________________  Petra Kuba, [name]     MEM/MEDQ  D:  01/01/2008  T:  01/01/2008  Job:  [id number]   cc:   Thora Lance, [name]  Fax: [phone]   Danise Edge, [name]  Fax: [phone]   [address]  [place] [address] [place]  [place]  [address] Dr. Sheliah Hatch

## 2010-11-07 NOTE — Procedures (Signed)
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   After informed consent, we cleaned the skin with isopropyl alcohol and  used EMG guidance to inject the following muscles:  Semimembranosus,  tendinosis, and biceps femoris as well as gastrocnemius muscles.  I  created the solution of 100 units of botulinum toxin A to 1 mL  preservative-free normal saline.  Using EMG guidance, I injected the  semimembranosus/tendinosis muscles with 100 units of Botox.  We injected  the left biceps femoris with 100 units of Botox in three different  access sites.  Additionally, I injected the gastrocnemius at 2 separate  access points with 100 units of Botox.  The patient tolerated with  minimal bleeding complications.   I will send him for outpatient physical therapy work on range of motion  and gait activities.  Encouraged the patient to work on range of motion  and strengthening aggressively at home.  Lot of his alcohol depends on  how much he pushes himself.   I will see him back in about 2 months.      Ranelle Oyster, [name]  Electronically Signed     ZTS/MEDQ  D:  10/05/2008 13:09:27  T:  10/06/2008 04:10:35  Job:  [id number]   cc:   Thora Lance, [name]  Fax: [phone]

## 2011-01-17 ENCOUNTER — Other Ambulatory Visit: Payer: Self-pay | Admitting: Gastroenterology

## 2011-03-22 LAB — DIFFERENTIAL
Eosinophils Absolute: 0.1
Eosinophils Relative: 2
Lymphs Abs: 1.8
Monocytes Absolute: 0.4
Monocytes Relative: 7

## 2011-03-22 LAB — COMPREHENSIVE METABOLIC PANEL
ALT: 166 — ABNORMAL HIGH
AST: 85 — ABNORMAL HIGH
Albumin: 3.6
CO2: 29
Calcium: 10
GFR calc Af Amer: >60
GFR calc non Af Amer: >60
Sodium: 138

## 2011-03-22 LAB — TYPE AND SCREEN

## 2011-03-22 LAB — CBC
MCHC: 33.8
RBC: 4.83
WBC: 5.8

## 2011-03-22 LAB — ABO/RH: ABO/RH(D): O POS

## 2011-03-27 LAB — GLUCOSE, CAPILLARY
Glucose-Capillary: 182 mg/dL — ABNORMAL HIGH (ref 70–99)
Glucose-Capillary: 238 mg/dL — ABNORMAL HIGH (ref 70–99)
Glucose-Capillary: 102 mg/dL — ABNORMAL HIGH (ref 70–99)
Glucose-Capillary: 102 mg/dL — ABNORMAL HIGH (ref 70–99)
Glucose-Capillary: 109 mg/dL — ABNORMAL HIGH (ref 70–99)
Glucose-Capillary: 109 mg/dL — ABNORMAL HIGH (ref 70–99)
Glucose-Capillary: 113 mg/dL — ABNORMAL HIGH (ref 70–99)
Glucose-Capillary: 114 mg/dL — ABNORMAL HIGH (ref 70–99)
Glucose-Capillary: 126 mg/dL — ABNORMAL HIGH (ref 70–99)
Glucose-Capillary: 127 mg/dL — ABNORMAL HIGH (ref 70–99)
Glucose-Capillary: 134 mg/dL — ABNORMAL HIGH (ref 70–99)
Glucose-Capillary: 147 mg/dL — ABNORMAL HIGH (ref 70–99)
Glucose-Capillary: 151 mg/dL — ABNORMAL HIGH (ref 70–99)
Glucose-Capillary: 155 mg/dL — ABNORMAL HIGH (ref 70–99)
Glucose-Capillary: 156 mg/dL — ABNORMAL HIGH (ref 70–99)
Glucose-Capillary: 156 mg/dL — ABNORMAL HIGH (ref 70–99)
Glucose-Capillary: 156 mg/dL — ABNORMAL HIGH (ref 70–99)
Glucose-Capillary: 160 mg/dL — ABNORMAL HIGH (ref 70–99)
Glucose-Capillary: 163 mg/dL — ABNORMAL HIGH (ref 70–99)
Glucose-Capillary: 168 mg/dL — ABNORMAL HIGH (ref 70–99)
Glucose-Capillary: 178 mg/dL — ABNORMAL HIGH (ref 70–99)
Glucose-Capillary: 182 mg/dL — ABNORMAL HIGH (ref 70–99)
Glucose-Capillary: 186 mg/dL — ABNORMAL HIGH (ref 70–99)
Glucose-Capillary: 198 mg/dL — ABNORMAL HIGH (ref 70–99)
Glucose-Capillary: 203 mg/dL — ABNORMAL HIGH (ref 70–99)
Glucose-Capillary: 206 mg/dL — ABNORMAL HIGH (ref 70–99)
Glucose-Capillary: 210 mg/dL — ABNORMAL HIGH (ref 70–99)
Glucose-Capillary: 212 mg/dL — ABNORMAL HIGH (ref 70–99)
Glucose-Capillary: 213 mg/dL — ABNORMAL HIGH (ref 70–99)
Glucose-Capillary: 218 mg/dL — ABNORMAL HIGH (ref 70–99)
Glucose-Capillary: 236 mg/dL — ABNORMAL HIGH (ref 70–99)
Glucose-Capillary: 240 mg/dL — ABNORMAL HIGH (ref 70–99)
Glucose-Capillary: 267 mg/dL — ABNORMAL HIGH (ref 70–99)
Glucose-Capillary: 61 mg/dL — ABNORMAL LOW (ref 70–99)
Glucose-Capillary: 61 mg/dL — ABNORMAL LOW (ref 70–99)
Glucose-Capillary: 67 mg/dL — ABNORMAL LOW (ref 70–99)
Glucose-Capillary: 69 mg/dL — ABNORMAL LOW (ref 70–99)
Glucose-Capillary: 72 mg/dL (ref 70–99)
Glucose-Capillary: 72 mg/dL (ref 70–99)
Glucose-Capillary: 77 mg/dL (ref 70–99)
Glucose-Capillary: 80 mg/dL (ref 70–99)
Glucose-Capillary: 80 mg/dL (ref 70–99)
Glucose-Capillary: 80 mg/dL (ref 70–99)
Glucose-Capillary: 83 mg/dL (ref 70–99)
Glucose-Capillary: 93 mg/dL (ref 70–99)
Glucose-Capillary: 97 mg/dL (ref 70–99)

## 2011-03-27 LAB — CBC
HCT: 27.7 % — ABNORMAL LOW (ref 39.0–52.0)
Hemoglobin: 9 g/dL — ABNORMAL LOW (ref 13.0–17.0)
MCV: 85 fL (ref 78.0–100.0)
Platelets: 360 10*3/uL (ref 150–400)
RBC: 3.28 MIL/uL — ABNORMAL LOW (ref 4.22–5.81)
RBC: 3.25 MIL/uL — ABNORMAL LOW (ref 4.22–5.81)
RBC: 3.61 MIL/uL — ABNORMAL LOW (ref 4.22–5.81)
WBC: 10.7 10*3/uL — ABNORMAL HIGH (ref 4.0–10.5)
WBC: 5.8 10*3/uL (ref 4.0–10.5)
WBC: 8.9 10*3/uL (ref 4.0–10.5)

## 2011-03-27 LAB — DIFFERENTIAL
Basophils Absolute: 0 10*3/uL (ref 0.0–0.1)
Basophils Relative: 0 % (ref 0–1)
Eosinophils Relative: 3 % (ref 0–5)
Lymphocytes Relative: 13 % (ref 12–46)
Lymphocytes Relative: 20 % (ref 12–46)
Monocytes Absolute: 0.6 10*3/uL (ref 0.1–1.0)
Monocytes Relative: 6 % (ref 3–12)
Neutro Abs: 8.3 10*3/uL — ABNORMAL HIGH (ref 1.7–7.7)
Neutrophils Relative %: 78 % — ABNORMAL HIGH (ref 43–77)

## 2011-03-27 LAB — URINE CULTURE
Colony Count: NO GROWTH
Culture: NO GROWTH
Special Requests: NEGATIVE

## 2011-03-27 LAB — URINALYSIS, ROUTINE W REFLEX MICROSCOPIC
Bilirubin Urine: NEGATIVE
Hgb urine dipstick: NEGATIVE
Ketones, ur: NEGATIVE mg/dL
Nitrite: NEGATIVE
Protein, ur: NEGATIVE mg/dL
Specific Gravity, Urine: 1.019 (ref 1.005–1.030)
Urobilinogen, UA: 0.2 mg/dL (ref 0.0–1.0)

## 2011-03-27 LAB — COMPREHENSIVE METABOLIC PANEL
AST: 19 U/L (ref 0–37)
Alkaline Phosphatase: 150 U/L — ABNORMAL HIGH (ref 39–117)
BUN: 16 mg/dL (ref 6–23)
CO2: 32 mEq/L (ref 19–32)
Chloride: 101 mEq/L (ref 96–112)
Creatinine, Ser: 0.82 mg/dL (ref 0.4–1.5)
GFR calc Af Amer: >60 mL/min (ref >60)
GFR calc non Af Amer: >60 mL/min (ref >60)
Total Bilirubin: 0.4 mg/dL (ref 0.3–1.2)

## 2011-03-27 LAB — BASIC METABOLIC PANEL
BUN: 22 mg/dL (ref 6–23)
Creatinine, Ser: 0.74 mg/dL (ref 0.4–1.5)
GFR calc Af Amer: >60 mL/min (ref >60)
GFR calc non Af Amer: >60 mL/min (ref >60)

## 2011-03-27 LAB — URINE MICROSCOPIC-ADD ON

## 2011-03-30 LAB — BASIC METABOLIC PANEL
BUN: 16 mg/dL (ref 6–23)
CO2: 27 mEq/L (ref 19–32)
Calcium: 9.2 mg/dL (ref 8.4–10.5)
Chloride: 102 mEq/L (ref 96–112)
Creatinine, Ser: 0.68 mg/dL (ref 0.4–1.5)
GFR calc Af Amer: >60 mL/min (ref >60)
GFR calc non Af Amer: >60 mL/min (ref >60)
Glucose, Bld: 154 mg/dL — ABNORMAL HIGH (ref 70–99)
Glucose, Bld: 84 mg/dL (ref 70–99)
Potassium: 3.8 mEq/L (ref 3.5–5.1)
Potassium: 4.1 mEq/L (ref 3.5–5.1)
Sodium: 135 mEq/L (ref 135–145)

## 2011-03-30 LAB — CBC
HCT: 28 % — ABNORMAL LOW (ref 39.0–52.0)
HCT: 31.3 % — ABNORMAL LOW (ref 39.0–52.0)
Hemoglobin: 10.3 g/dL — ABNORMAL LOW (ref 13.0–17.0)
Hemoglobin: 9.3 g/dL — ABNORMAL LOW (ref 13.0–17.0)
MCHC: 32.3 g/dL (ref 30.0–36.0)
MCHC: 32.9 g/dL (ref 30.0–36.0)
MCV: 82.5 fL (ref 78.0–100.0)
Platelets: 276 10*3/uL (ref 150–400)
RBC: 3.83 MIL/uL — ABNORMAL LOW (ref 4.22–5.81)
RDW: 15.5 % (ref 11.5–15.5)
RDW: 15.6 % — ABNORMAL HIGH (ref 11.5–15.5)
WBC: 5.5 10*3/uL (ref 4.0–10.5)

## 2011-03-30 LAB — GLUCOSE, CAPILLARY
Glucose-Capillary: 154 mg/dL — ABNORMAL HIGH (ref 70–99)
Glucose-Capillary: 160 mg/dL — ABNORMAL HIGH (ref 70–99)
Glucose-Capillary: 186 mg/dL — ABNORMAL HIGH (ref 70–99)
Glucose-Capillary: 211 mg/dL — ABNORMAL HIGH (ref 70–99)
Glucose-Capillary: 219 mg/dL — ABNORMAL HIGH (ref 70–99)

## 2011-03-30 LAB — DIFFERENTIAL
Basophils Relative: 1 % (ref 0–1)
Eosinophils Absolute: 0.2 10*3/uL (ref 0.0–0.7)
Neutrophils Relative %: 69 % (ref 43–77)

## 2011-03-30 LAB — LIPID PANEL
Cholesterol: 154 mg/dL (ref 0–200)
LDL Cholesterol: 98 mg/dL (ref 0–99)
Triglycerides: 146 mg/dL (ref <150)
VLDL: 29 mg/dL (ref 0–40)

## 2011-03-30 LAB — POCT CARDIAC MARKERS: Myoglobin, poc: 98.9 ng/mL (ref 12–200)

## 2011-03-30 LAB — LITHIUM LEVEL: Lithium Lvl: 0.27 mEq/L — ABNORMAL LOW (ref 0.80–1.40)

## 2011-03-30 LAB — IRON AND TIBC: Saturation Ratios: 13 % — ABNORMAL LOW (ref 20–55)

## 2011-03-30 LAB — CULTURE, BLOOD (ROUTINE X 2): Culture: NO GROWTH

## 2011-03-30 LAB — POCT I-STAT, CHEM 8
BUN: 18 mg/dL (ref 6–23)
Calcium, Ion: 1.06 mmol/L — ABNORMAL LOW (ref 1.12–1.32)
Creatinine, Ser: 1 mg/dL (ref 0.4–1.5)
TCO2: 25 mmol/L (ref 0–100)

## 2011-03-30 LAB — B-NATRIURETIC PEPTIDE (CONVERTED LAB): Pro B Natriuretic peptide (BNP): <30 pg/mL (ref 0.0–100.0)

## 2011-03-30 LAB — D-DIMER, QUANTITATIVE: D-Dimer, Quant: 2.2 ug/mL-FEU — ABNORMAL HIGH (ref 0.00–0.48)

## 2011-03-30 LAB — FOLATE: Folate: >20 ng/mL

## 2011-03-30 LAB — CARDIAC PANEL(CRET KIN+CKTOT+MB+TROPI)
CK, MB: 2.2 ng/mL (ref 0.3–4.0)
Relative Index: INVALID (ref 0.0–2.5)
Troponin I: 0.01 ng/mL (ref 0.00–0.06)
Troponin I: 0.01 ng/mL (ref 0.00–0.06)

## 2011-03-30 LAB — FERRITIN: Ferritin: 62 ng/mL (ref 22–322)

## 2011-03-30 LAB — CK TOTAL AND CKMB (NOT AT ARMC): CK, MB: 2.7 ng/mL (ref 0.3–4.0)

## 2011-03-30 LAB — PROTIME-INR: INR: 1 (ref 0.00–1.49)

## 2011-03-30 LAB — RETICULOCYTES
RBC.: 4.01 MIL/uL — ABNORMAL LOW (ref 4.22–5.81)
Retic Count, Absolute: 64.2 10*3/uL (ref 19.0–186.0)

## 2011-03-30 LAB — HEMOGLOBIN A1C: Hgb A1c MFr Bld: 6.8 % — ABNORMAL HIGH (ref 4.6–6.1)

## 2011-04-12 LAB — CK TOTAL AND CKMB (NOT AT ARMC)
CK, MB: 5.5 — ABNORMAL HIGH
CK, MB: 5.6 — ABNORMAL HIGH
Relative Index: 2.1
Total CK: 238 — ABNORMAL HIGH
Total CK: 262 — ABNORMAL HIGH

## 2011-04-12 LAB — URINALYSIS, ROUTINE W REFLEX MICROSCOPIC
Glucose, UA: NEGATIVE
Hgb urine dipstick: NEGATIVE
Specific Gravity, Urine: 1.017
Urobilinogen, UA: 1
pH: 5.5

## 2011-04-12 LAB — COMPREHENSIVE METABOLIC PANEL
ALT: 189 — ABNORMAL HIGH
Albumin: 3.4 — ABNORMAL LOW
Alkaline Phosphatase: 302 — ABNORMAL HIGH
Chloride: 103
Glucose, Bld: 96
Potassium: 3.5
Sodium: 139
Total Bilirubin: 0.9
Total Protein: 7.1

## 2011-04-12 LAB — BASIC METABOLIC PANEL
CO2: 28
Calcium: 9.3
Creatinine, Ser: 0.93
GFR calc Af Amer: >60
GFR calc non Af Amer: >60

## 2011-04-12 LAB — CBC
Hemoglobin: 11.6 — ABNORMAL LOW
MCHC: 33.7
Platelets: 318
RBC: 4.18 — ABNORMAL LOW
RDW: 14.2 — ABNORMAL HIGH
WBC: 7.3

## 2011-04-12 LAB — TROPONIN I: Troponin I: 0.05

## 2011-04-12 LAB — B-NATRIURETIC PEPTIDE (CONVERTED LAB): Pro B Natriuretic peptide (BNP): <30

## 2011-04-12 LAB — DIFFERENTIAL
Basophils Relative: 0
Eosinophils Absolute: 0.2
Eosinophils Relative: 2
Monocytes Absolute: 0.3
Monocytes Relative: 5

## 2011-09-12 ENCOUNTER — Encounter (HOSPITAL_COMMUNITY): Payer: Self-pay | Admitting: *Deleted

## 2011-09-12 ENCOUNTER — Encounter (HOSPITAL_COMMUNITY)
Admission: RE | Admit: 2011-09-12 | Discharge: 2011-09-12 | Disposition: A | Discharge status: Home / Self Care | Payer: Medicare Other | Source: Ambulatory Visit | Attending: Gastroenterology | Admitting: Gastroenterology

## 2011-09-12 ENCOUNTER — Other Ambulatory Visit: Payer: Self-pay

## 2011-09-12 NOTE — Anesthesia Preprocedure Evaluation (Addendum)
Anesthesia Evaluation  Patient identified by MRN, date of birth, ID band Patient awake    Reviewed: Allergy & Precautions, H&P , NPO status , Patient's Chart, lab work & pertinent test results  Airway Mallampati: II TM Distance: >3 FB Neck ROM: Full    Dental  (+) Teeth Intact, Partial Upper, Partial Lower and Dental Advisory Given   Pulmonary neg pulmonary ROS,  breath sounds clear to auscultation  Pulmonary exam normal       Cardiovascular hypertension, Pt. 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  Neuromuscular disease negative neurological ROS  negative psych ROS   GI/Hepatic negative GI ROS, Neg liver ROS, GERD-  Medicated and Controlled,  Endo/Other  negative endocrine ROSDiabetes mellitus-, Poorly Controlled, Type 2, Insulin Dependent  Renal/GU negative Renal ROS   History of prostate cancer; s/p radioactive seed implants    Musculoskeletal negative musculoskeletal ROS (+)   Abdominal   Peds  Hematology negative hematology ROS (+)   Anesthesia Other Findings   Reproductive/Obstetrics negative OB ROS                         Anesthesia Physical Anesthesia Plan  ASA: III  Anesthesia Plan: General   Post-op Pain Management:    Induction: Intravenous  Airway Management Planned: Nasal Cannula  Additional Equipment:   Intra-op Plan:   Post-operative Plan:   Informed Consent: I have reviewed the patients History and Physical, chart, labs and discussed the procedure including the risks, benefits and alternatives for the proposed anesthesia with the patient or authorized  representative who has indicated his/her understanding and acceptance.   Dental advisory given  Plan Discussed with: CRNA  Anesthesia Plan Comments: (In )       Anesthesia Quick Evaluation

## 2011-09-13 ENCOUNTER — Ambulatory Visit (HOSPITAL_COMMUNITY): Payer: Medicare Other | Admitting: Anesthesiology

## 2011-09-13 ENCOUNTER — Encounter (HOSPITAL_COMMUNITY): Payer: Self-pay

## 2011-09-13 ENCOUNTER — Ambulatory Visit (HOSPITAL_COMMUNITY): Payer: Medicare Other

## 2011-09-13 ENCOUNTER — Ambulatory Visit (HOSPITAL_COMMUNITY)
Admission: RE | Admit: 2011-09-13 | Discharge: 2011-09-13 | Disposition: A | Discharge status: Home / Self Care | Payer: Medicare Other | Source: Ambulatory Visit | Attending: Gastroenterology | Admitting: Gastroenterology

## 2011-09-13 ENCOUNTER — Encounter (HOSPITAL_COMMUNITY)
Admission: RE | Disposition: A | Discharge status: Home / Self Care | Payer: Self-pay | Source: Ambulatory Visit | Attending: Gastroenterology

## 2011-09-13 ENCOUNTER — Encounter (HOSPITAL_COMMUNITY): Payer: Self-pay | Admitting: Anesthesiology

## 2011-09-13 DIAGNOSIS — R7989 Other specified abnormal findings of blood chemistry: Secondary | ICD-10-CM | POA: Insufficient documentation

## 2011-09-13 DIAGNOSIS — K8309 Other cholangitis: Secondary | ICD-10-CM | POA: Insufficient documentation

## 2011-09-13 DIAGNOSIS — Z01812 Encounter for preprocedural laboratory examination: Secondary | ICD-10-CM | POA: Insufficient documentation

## 2011-09-13 HISTORY — DX: Malignant (primary) neoplasm, unspecified: C80.1

## 2011-09-13 HISTORY — DX: Gastro-esophageal reflux disease without esophagitis: K21.9

## 2011-09-13 HISTORY — DX: Essential (primary) hypertension: I10

## 2011-09-13 HISTORY — DX: Myoneural disorder, unspecified: G70.9

## 2011-09-13 HISTORY — DX: Chronic kidney disease, unspecified: N18.9

## 2011-09-13 HISTORY — DX: Unspecified convulsions: R56.9

## 2011-09-13 HISTORY — PX: ERCP: SHX5425

## 2011-09-13 LAB — GLUCOSE, CAPILLARY: Glucose-Capillary: 128 mg/dL — ABNORMAL HIGH (ref 70–99)

## 2011-09-13 SURGERY — ERCP, WITH INTERVENTION IF INDICATED
Anesthesia: General

## 2011-09-13 MED ORDER — SODIUM CHLORIDE 0.9 % IV SOLN
INTRAVENOUS | Status: AC
  Filled 2011-09-13: qty 1.5

## 2011-09-13 MED ORDER — AMPICILLIN-SULBACTAM SODIUM 1.5 (1-0.5) G IJ SOLR
1.5000 g | INTRAMUSCULAR | Status: AC
  Administered 2011-09-13: 1.5 g via INTRAVENOUS
  Filled 2011-09-13: qty 1.5

## 2011-09-13 MED ORDER — ONDANSETRON HCL 4 MG/2ML IJ SOLN
INTRAMUSCULAR | Status: DC | PRN
  Administered 2011-09-13: 2 mg via INTRAVENOUS

## 2011-09-13 MED ORDER — SUCCINYLCHOLINE CHLORIDE 20 MG/ML IJ SOLN
INTRAMUSCULAR | Status: DC | PRN
  Administered 2011-09-13: 100 mg via INTRAVENOUS

## 2011-09-13 MED ORDER — GLUCAGON HCL (RDNA) 1 MG IJ SOLR
INTRAMUSCULAR | Status: AC
  Filled 2011-09-13: qty 1

## 2011-09-13 MED ORDER — SODIUM CHLORIDE 0.9 % IV SOLN
INTRAVENOUS | Status: DC | PRN
  Administered 2011-09-13: 10:00:00

## 2011-09-13 MED ORDER — PROMETHAZINE HCL 25 MG/ML IJ SOLN: 6.2500 mg | INTRAMUSCULAR | Status: DC | PRN

## 2011-09-13 MED ORDER — EPHEDRINE SULFATE 50 MG/ML IJ SOLN
INTRAMUSCULAR | Status: DC | PRN
  Administered 2011-09-13: 10 mg via INTRAVENOUS
  Administered 2011-09-13 (×2): 5 mg via INTRAVENOUS

## 2011-09-13 MED ORDER — GLYCOPYRROLATE 0.2 MG/ML IJ SOLN
INTRAMUSCULAR | Status: DC | PRN
  Administered 2011-09-13: .4 mg via INTRAVENOUS

## 2011-09-13 MED ORDER — MIDAZOLAM HCL 5 MG/5ML IJ SOLN
INTRAMUSCULAR | Status: DC | PRN
  Administered 2011-09-13: 2 mg via INTRAVENOUS

## 2011-09-13 MED ORDER — SODIUM CHLORIDE 0.9 % IV SOLN
INTRAVENOUS | Status: DC | PRN
  Administered 2011-09-13: 09:00:00 via INTRAVENOUS

## 2011-09-13 MED ORDER — LIDOCAINE HCL (CARDIAC) 20 MG/ML IV SOLN
INTRAVENOUS | Status: DC | PRN
  Administered 2011-09-13: 30 mg via INTRAVENOUS

## 2011-09-13 MED ORDER — PROPOFOL 10 MG/ML IV EMUL
INTRAVENOUS | Status: DC | PRN
  Administered 2011-09-13: 180 mg via INTRAVENOUS

## 2011-09-13 MED ORDER — LACTATED RINGERS IV SOLN
INTRAVENOUS | Status: DC
  Administered 2011-09-13: 1000 mL via INTRAVENOUS

## 2011-09-13 MED ORDER — CISATRACURIUM BESYLATE 2 MG/ML IV SOLN
INTRAVENOUS | Status: DC | PRN
  Administered 2011-09-13: 8 mg via INTRAVENOUS
  Administered 2011-09-13 (×2): 1 mg via INTRAVENOUS

## 2011-09-13 MED ORDER — MEPERIDINE HCL 100 MG/ML IJ SOLN: 6.2500 mg | INTRAMUSCULAR | Status: DC | PRN

## 2011-09-13 MED ORDER — NEOSTIGMINE METHYLSULFATE 1 MG/ML IJ SOLN
INTRAMUSCULAR | Status: DC | PRN
  Administered 2011-09-13: 3 mg via INTRAVENOUS

## 2011-09-13 MED ORDER — FENTANYL CITRATE 0.05 MG/ML IJ SOLN
INTRAMUSCULAR | Status: DC | PRN
  Administered 2011-09-13 (×5): 25 ug via INTRAVENOUS

## 2011-09-13 MED ORDER — LACTATED RINGERS IV SOLN
INTRAVENOUS | Status: DC | PRN
  Administered 2011-09-13: 07:00:00 via INTRAVENOUS

## 2011-09-13 MED ORDER — LACTATED RINGERS IV SOLN: INTRAVENOUS | Status: DC

## 2011-09-13 NOTE — Discharge Instructions (Addendum)
Call if increased abdominal pain nausea vomit or temperature greater than 101 or any other GI question or problem We try to get his urso any way he can including off the Internet  Endoscopy Care After Please read the instructions outlined below and refer to this sheet in the next few weeks. These discharge instructions provide you with general information on caring for yourself after you leave the hospital. Your doctor may also give you specific instructions. While your treatment has been planned according to the most current medical practices available, unavoidable complications occasionally occur. If you have any problems or questions after discharge, please call your doctor. HOME CARE INSTRUCTIONS Activity  You may resume your regular activity but move at a slower pace for the next 24 hours.   Take frequent rest periods for the next 24 hours.   Walking will help expel (get rid of) the air and reduce the bloated feeling in your abdomen.   No driving for 24 hours (because of the anesthesia (medicine) used during the test).   You may shower.   Do not sign any important legal documents or operate any machinery for 24 hours (because of the anesthesia used during the test).  Nutrition  Drink plenty of fluids.   You may resume your normal diet.   Begin with a light meal and progress to your normal diet.   Avoid alcoholic beverages for 24 hours or as instructed by your caregiver.  Medications You may resume your normal medications unless your caregiver tells you otherwise. What you can expect today  You may experience abdominal discomfort such as a feeling of fullness or "gas" pains.   You may experience a sore throat for 2 to 3 days. This is normal. Gargling with salt water may help this.  Follow-up Your doctor will discuss the results of your test with you. SEEK IMMEDIATE MEDICAL CARE IF:  You have excessive nausea (feeling sick to your stomach) and/or vomiting.   You have  severe abdominal pain and distention (swelling).   You have trouble swallowing.   You have a temperature over 100 F (37.8 C).   You have rectal bleeding or vomiting of blood.  Document Released: 01/24/2004 Document Revised: 05/31/2011 Document Reviewed: 08/06/2007 [facility] Patient Information 2012 [place], [place].  Endoscopic Retrograde Cholangiopancreatography This is a test used to evaluate people with jaundice (a condition in which the person's skin is turning yellow because of the high level of bilirubin in your blood). Bilirubin is a product of the blood which is elevated when an obstruction in the bile duct occurs. The bile ducts are the pipe-like system which carry the bile from the liver to the gallbladder and out into your small bowel. In this test, a fiber optic endoscope (a small pencil-sized telescope) is inserted and a catheter is put into ducts for examination. This test can reveal stones, strictures, cysts, tumors, and other irregularities within the pancreatic ducts and bile ducts. PREPARATION FOR TEST Do not eat or drink after midnight the day before the test.  NORMAL FINDINGS Normal size of biliary and pancreatic ducts. No obstruction or filling defects within the biliary or pancreatic ducts. Ranges for normal findings may vary among different laboratories and hospitals. You should always check with your doctor after having lab work or other tests done to discuss the meaning of your test results and whether your values are considered within normal limits. MEANING OF TEST  Your caregiver will go over the test results with you and discuss the  importance and meaning of your results, as well as treatment options and the need for additional tests if necessary. OBTAINING THE TEST RESULTS  It is your responsibility to obtain your test results. Ask the lab or department performing the test when and how you will get your results. Document Released: 10/05/2004 Document Revised:  02/21/2011 Document Reviewed: 05/21/2008 [facility] Patient Information 2012 [place], [place].

## 2011-09-13 NOTE — Op Note (Signed)
[facility] [address] 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  The Pentax ERCP [id number] endoscope was introduced through the mouth and advanced to the . The second portion of the duodenum and a normal ampulla was brought into view. Using the triple lumen sphincterotome loaded with the JAG Jagwire deep selective cannulation was obtained and there was no pancreatic duct injections or wire advancements throughout the procedure. The CBD and lower intrahepatics were filled with obvious changes of sclerosing cholangitis and we initially cannulated the right side and proceeded with 3 dilations in a stepwise fashion after brushings were done first using the 4 cm 8 mm dilating balloon. There was excellent drainage of this side for the remainder of the procedure we did use the stone balloon a few time with injection below the balloon to confirm the strictures were adequately dilated and dye drained well. To cannulate the left side of the system we could not advance the wire using the various balloons so we reinserted the sphincterotome and turned the head and were able to get deep selective cannulation of the left side and initially dilated that stricture as above but there was still a waist despite holding the balloon for a minute. We went ahead  and used the stone balloon for an occlusion cholangiogram which confirmed the stricture still present on the left side but a widely patent right side and we elected to dilate the left side further using the 4 cm 10 mm balloon and held that for a minute although there was still a waist present this side seemed to drain better and we elected to stop the procedure at this time and the balloon dilator and wire were removed. The limited examination was normal using the side-viewing scope with no endoscopic findings except some residual food in the stomach. The scope was then completely withdrawn from the patient and the procedure terminated. <<PROCEDUREIMAGES>>  COMPLICATIONS:  None  ENDOSCOPIC IMPRESSION: Sclerosing cholangitis status post brushing and multiple dilations of both the right and left side with seemingly adequate biliary drainage elected to see how he does without stenting 2. Some residual food in the stomach 3. No PD wire advancements or injection throughout the RECOMMENDATIONS: Observe for delayed complications call me when necessary repeat liver tests in one week work hard to find some ursodeoxycholine somewhere  since possibly stopping that played a role with his recurrence and he will call me sooner when necessary   ______________________________ Vida Rigger, MD  CC:  n. eSIGNEDVida Rigger at 09/13/2011 10:09 AM  Page 2 of 3   Fredda Hammed, [phone]

## 2011-09-13 NOTE — Progress Notes (Signed)
Patient in LL position pressure point pads to extremities head cradle in place

## 2011-09-13 NOTE — Anesthesia Postprocedure Evaluation (Signed)
  Anesthesia Post-op Note  Patient: Hunter Moss  Procedure(s) Performed: Procedure(s) (LRB): ENDOSCOPIC RETROGRADE CHOLANGIOPANCREATOGRAPHY (ERCP) (N/A)  Patient Location: PACU  Anesthesia Type: General  Level of Consciousness: awake and alert   Airway and Oxygen Therapy: Patient Spontanous Breathing  Post-op Pain: mild  Post-op Assessment: Post-op Vital signs reviewed, Patient's Cardiovascular Status Stable, Respiratory Function Stable, Patent Airway and No signs of Nausea or vomiting  Post-op Vital Signs: stable  Complications: No apparent anesthesia complications

## 2011-09-13 NOTE — Progress Notes (Signed)
Hunter Moss 8:16 AM   Subjective: Patient has been in his normal state of health with multiple medical problems unfortunately his urso was on backorder and he has not had any since January and he began having increased itching dark urine and yellow jaundice and liver tests were significantly elevated and with his history of PSC and atypia on brushing despite no fever and no abdominal pain we elected to proceed with repeat ERCP. The rest of this history was reviewed and his office chart and computer was reviewed and the case was discussed with both the patient and his wife and all their questions were answered  Objective: Vital signs stable afebrile lungs are clear heart regular rate and rhythm abdomen is soft nontender labs in the office yesterday reviewed  Assessment: Increased liver tests in a patient with PSC want to reevaluate the ducts in a patient with multiple medical problem  Plan: The risks benefits methods of ERCP possible stent versus dilation was discussed again with both the patient and his wife and will proceed this morning with further workup and plan pending those findings and will ask them to keep the appointment tomorrow with his primary care physician to discuss his other medicine issues including his recent seizure which he's had before  [facility]

## 2011-09-13 NOTE — Transfer of Care (Signed)
Immediate Anesthesia Transfer of Care Note  Patient: Hunter Moss  Procedure(s) Performed: Procedure(s) (LRB): ENDOSCOPIC RETROGRADE CHOLANGIOPANCREATOGRAPHY (ERCP) (N/A)  Patient Location: PACU  Anesthesia Type: General  Level of Consciousness: sedated  Airway & Oxygen Therapy: Patient Spontanous Breathing and Patient connected to face mask oxygen  Post-op Assessment: Report given to PACU RN  Post vital signs: Reviewed and stable  Complications: No apparent anesthesia complications

## 2011-09-14 ENCOUNTER — Encounter (HOSPITAL_COMMUNITY): Payer: Self-pay | Admitting: Gastroenterology

## 2012-04-02 IMAGING — CT CT HEAD W/O CM
1 series · 16 of 30 positions shown, 20 images · non-contrast
Comparison: Head CT scan 05/25/2008.

CLINICAL DATA: Altered mental status.  General weakness.

CT HEAD WITHOUT CONTRAST
TECHNIQUE: Contiguous axial images were obtained from the base of
the skull through the vertex without contrast.

[Series 2: head_seq 4.5 h37s st · axial · 0.43mm/px · z∈[+1255,+1394]mm · 16 of 35 slices shown, 20 images]
[im 2/35  brain]
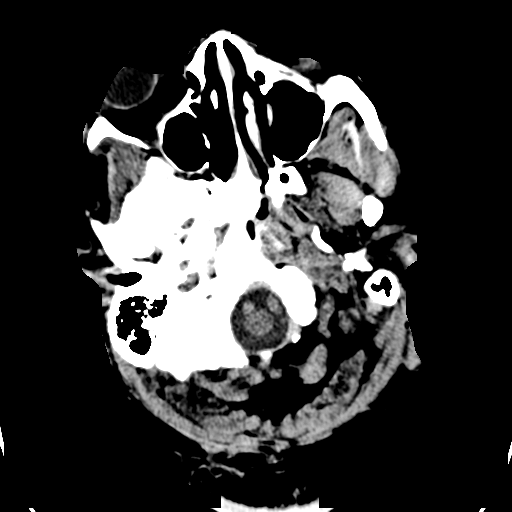
[im 2/35  bone]
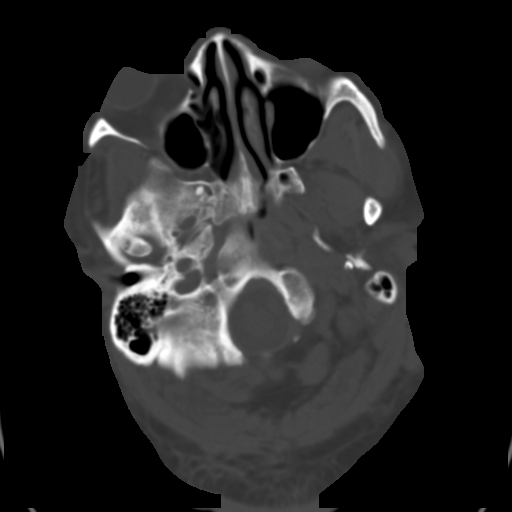
[im 4/35  brain]
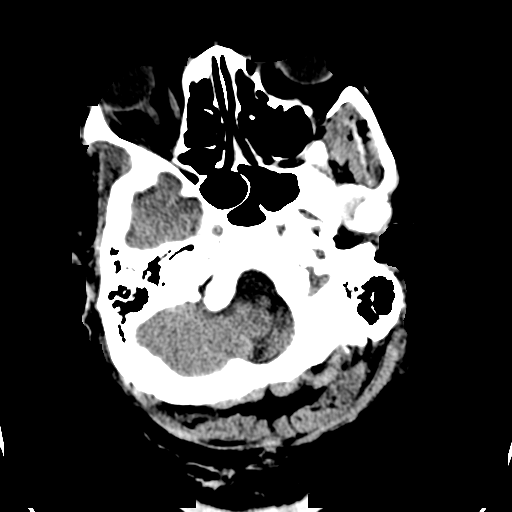
[im 6/35  brain]
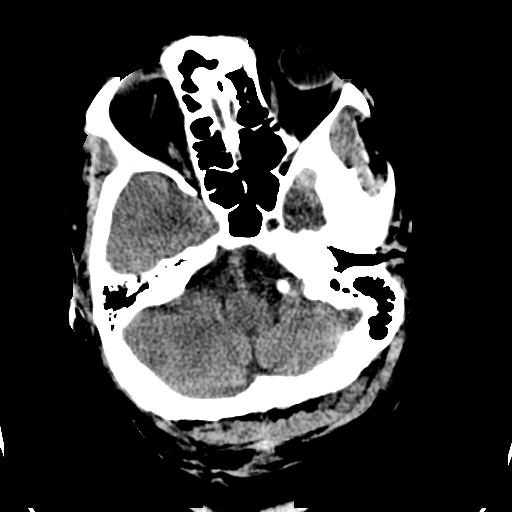
[im 9/35  brain]
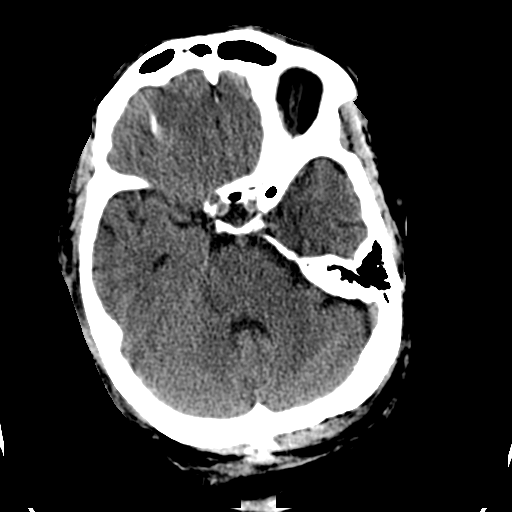
[im 10/35  brain]
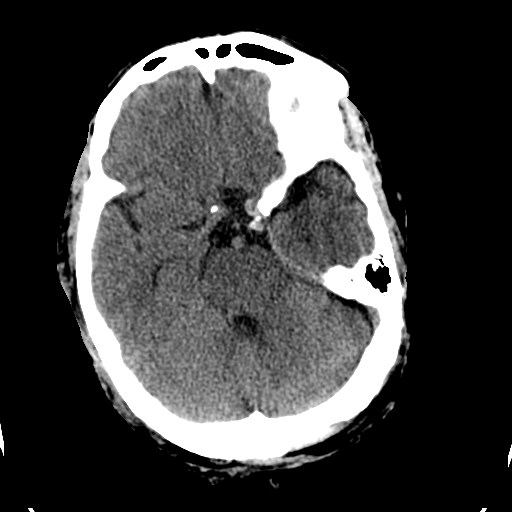
[im 10/35  bone]
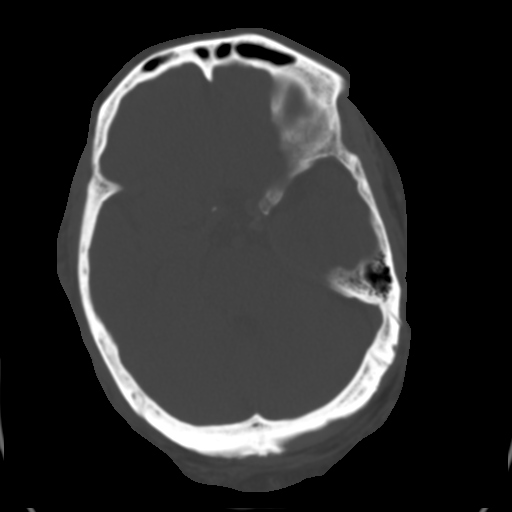
[im 12/35  brain]
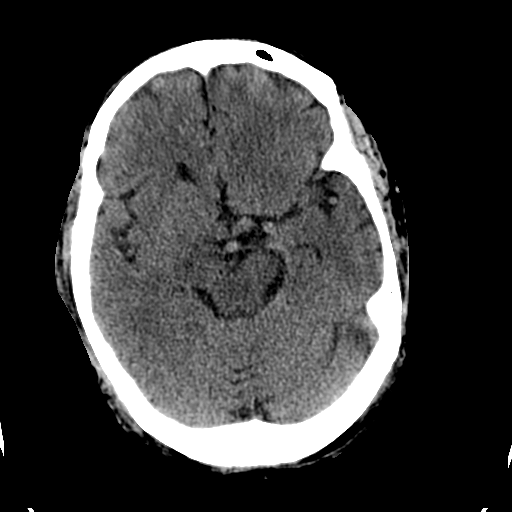
[im 15/35  brain]
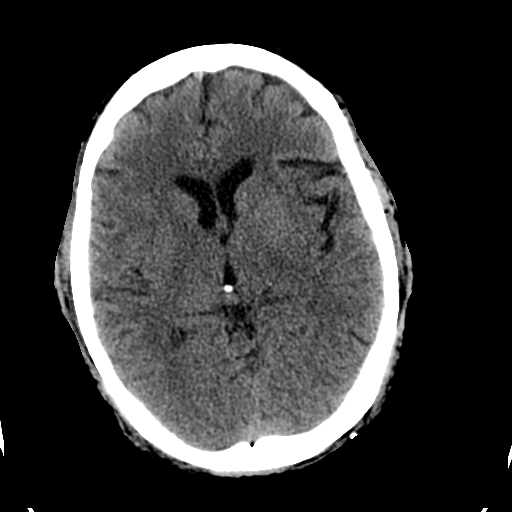
[im 17/35  brain]
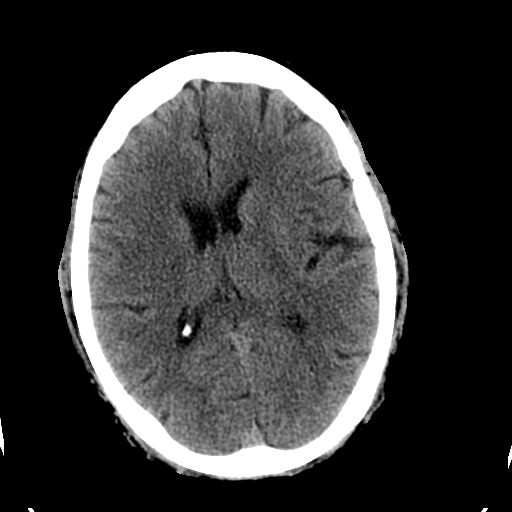
[im 18/35  brain]
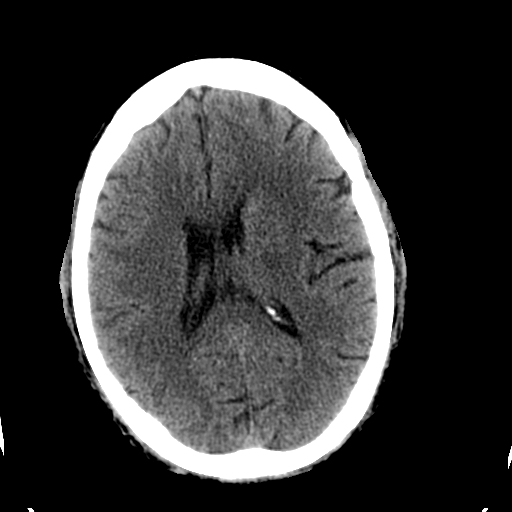
[im 18/35  bone]
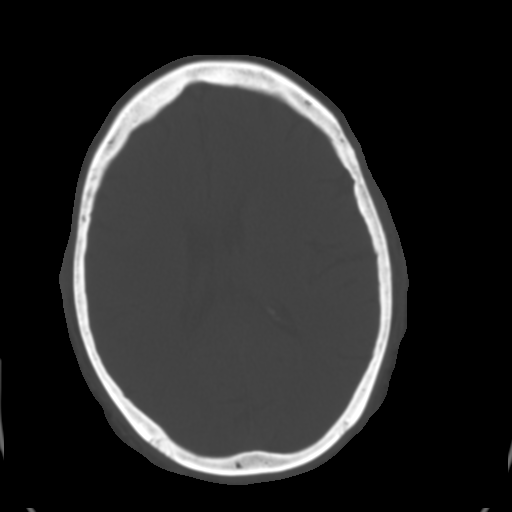
[im 20/35  brain]
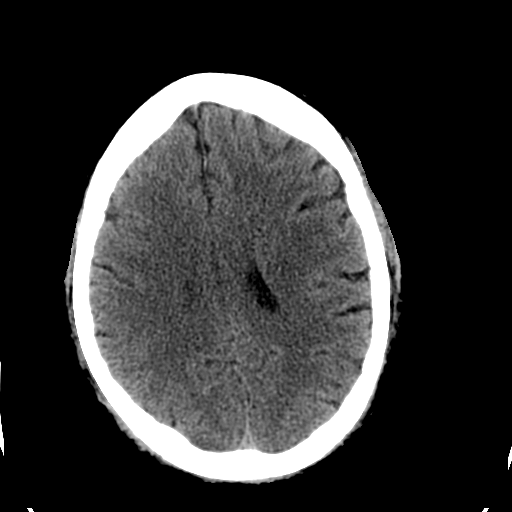
[im 23/35  brain]
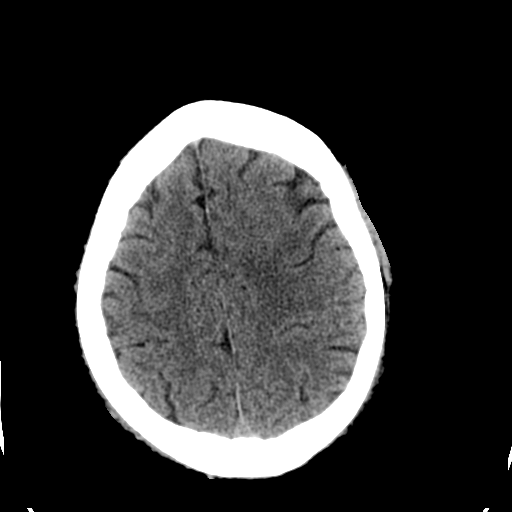
[im 25/35  brain]
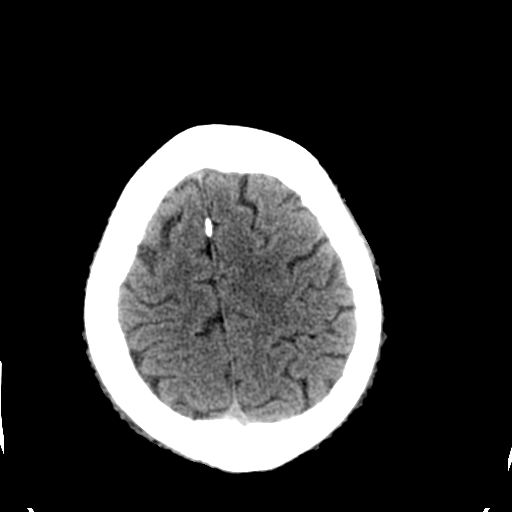
[im 26/35  brain]
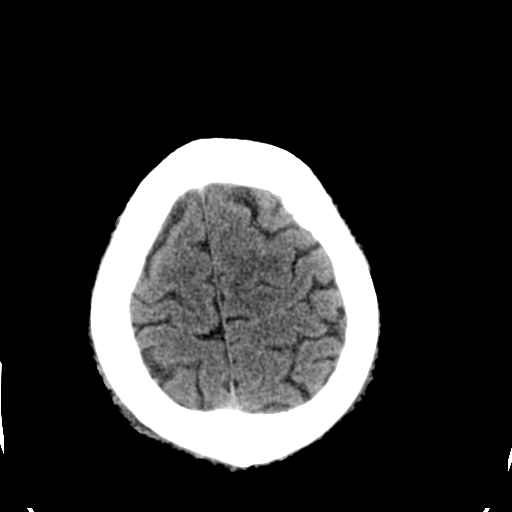
[im 26/35  bone]
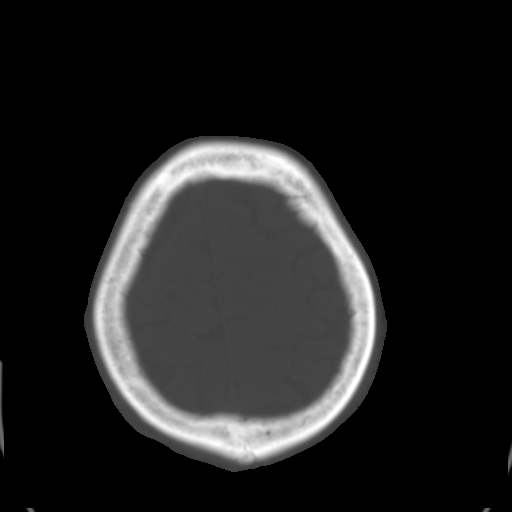
[im 29/35  brain]
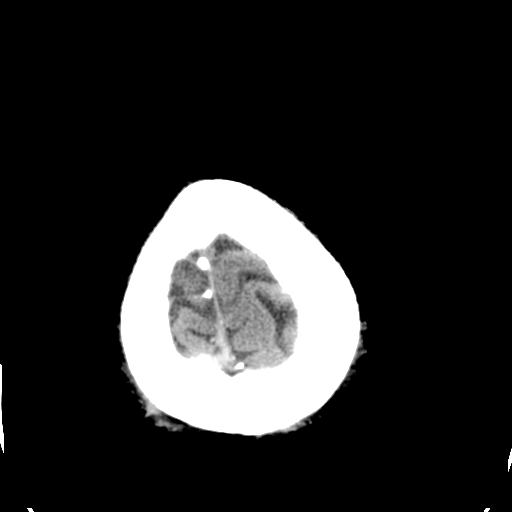
[im 31/35  brain]
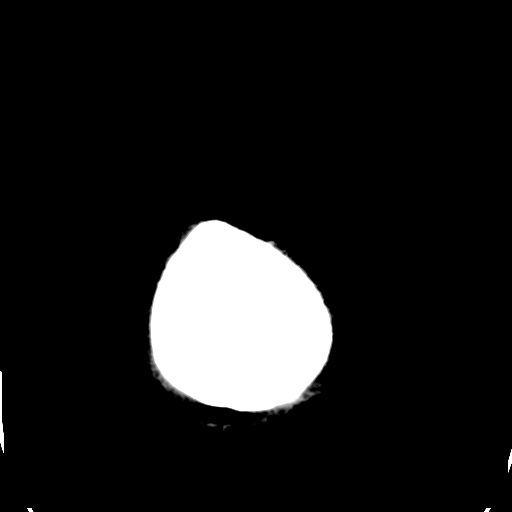
[im 33/35  brain]
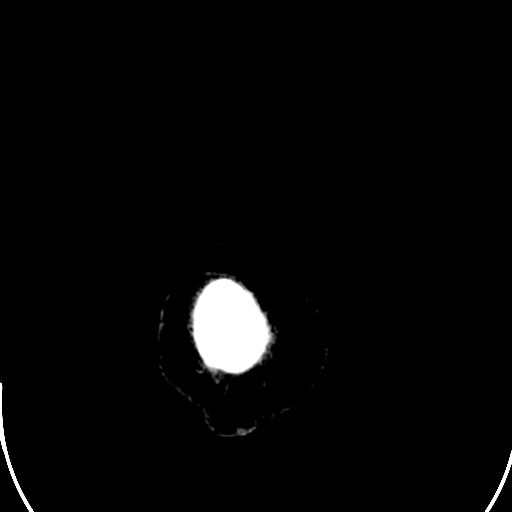

[16 of 30 positions shown; findings below may reference images not displayed]

FINDINGS: There is no evidence of acute intracranial abnormality
including acute infarction, hemorrhage, mass lesion, mass effect,
midline shift or abnormal extra-axial fluid collection.  No
hydrocephalus.  Partial visualization of a mucous retention cyst or
polyp right maxillary sinus noted.  Visualized paranasal sinuses
and mastoid air cells otherwise clear.  The calvarium intact.  No
pneumocephalus.
IMPRESSION: No acute finding.

## 2012-09-01 ENCOUNTER — Encounter (INDEPENDENT_AMBULATORY_CARE_PROVIDER_SITE_OTHER): Payer: Medicare Other | Admitting: Ophthalmology

## 2012-09-01 DIAGNOSIS — E1139 Type 2 diabetes mellitus with other diabetic ophthalmic complication: Secondary | ICD-10-CM

## 2012-09-01 DIAGNOSIS — H43819 Vitreous degeneration, unspecified eye: Secondary | ICD-10-CM

## 2013-06-03 ENCOUNTER — Emergency Department (HOSPITAL_COMMUNITY): Payer: Medicare Other

## 2013-06-03 ENCOUNTER — Emergency Department (HOSPITAL_COMMUNITY)
Admission: EM | Admit: 2013-06-03 | Discharge: 2013-06-25 | Disposition: E | Payer: Medicare Other | Attending: Emergency Medicine | Admitting: Emergency Medicine

## 2013-06-03 ENCOUNTER — Encounter (HOSPITAL_COMMUNITY): Payer: Self-pay | Admitting: Emergency Medicine

## 2013-06-03 DIAGNOSIS — I498 Other specified cardiac arrhythmias: Secondary | ICD-10-CM | POA: Insufficient documentation

## 2013-06-03 DIAGNOSIS — R578 Other shock: Secondary | ICD-10-CM | POA: Insufficient documentation

## 2013-06-03 DIAGNOSIS — R0902 Hypoxemia: Secondary | ICD-10-CM | POA: Insufficient documentation

## 2013-06-03 DIAGNOSIS — R141 Gas pain: Secondary | ICD-10-CM | POA: Insufficient documentation

## 2013-06-03 DIAGNOSIS — IMO0002 Reserved for concepts with insufficient information to code with codable children: Secondary | ICD-10-CM

## 2013-06-03 DIAGNOSIS — Z8546 Personal history of malignant neoplasm of prostate: Secondary | ICD-10-CM | POA: Insufficient documentation

## 2013-06-03 DIAGNOSIS — J9600 Acute respiratory failure, unspecified whether with hypoxia or hypercapnia: Secondary | ICD-10-CM | POA: Insufficient documentation

## 2013-06-03 DIAGNOSIS — S0180XA Unspecified open wound of other part of head, initial encounter: Secondary | ICD-10-CM | POA: Insufficient documentation

## 2013-06-03 DIAGNOSIS — K219 Gastro-esophageal reflux disease without esophagitis: Secondary | ICD-10-CM | POA: Insufficient documentation

## 2013-06-03 DIAGNOSIS — W3400XA Accidental discharge from unspecified firearms or gun, initial encounter: Secondary | ICD-10-CM | POA: Insufficient documentation

## 2013-06-03 DIAGNOSIS — Z87898 Personal history of other specified conditions: Secondary | ICD-10-CM

## 2013-06-03 DIAGNOSIS — G709 Myoneural disorder, unspecified: Secondary | ICD-10-CM | POA: Insufficient documentation

## 2013-06-03 DIAGNOSIS — R142 Eructation: Secondary | ICD-10-CM | POA: Insufficient documentation

## 2013-06-03 DIAGNOSIS — Y929 Unspecified place or not applicable: Secondary | ICD-10-CM | POA: Insufficient documentation

## 2013-06-03 DIAGNOSIS — E119 Type 2 diabetes mellitus without complications: Secondary | ICD-10-CM | POA: Insufficient documentation

## 2013-06-03 DIAGNOSIS — R571 Hypovolemic shock: Secondary | ICD-10-CM

## 2013-06-03 DIAGNOSIS — T148XXA Other injury of unspecified body region, initial encounter: Secondary | ICD-10-CM

## 2013-06-03 DIAGNOSIS — S1190XA Unspecified open wound of unspecified part of neck, initial encounter: Secondary | ICD-10-CM

## 2013-06-03 DIAGNOSIS — I10 Essential (primary) hypertension: Secondary | ICD-10-CM | POA: Insufficient documentation

## 2013-06-03 DIAGNOSIS — Y939 Activity, unspecified: Secondary | ICD-10-CM | POA: Insufficient documentation

## 2013-06-03 DIAGNOSIS — R569 Unspecified convulsions: Secondary | ICD-10-CM | POA: Insufficient documentation

## 2013-06-03 DIAGNOSIS — Z87442 Personal history of urinary calculi: Secondary | ICD-10-CM | POA: Insufficient documentation

## 2013-06-03 DIAGNOSIS — I469 Cardiac arrest, cause unspecified: Secondary | ICD-10-CM | POA: Insufficient documentation

## 2013-06-03 HISTORY — DX: Type 2 diabetes mellitus without complications: E11.9

## 2013-06-03 LAB — POCT I-STAT TROPONIN I

## 2013-06-03 LAB — CBC WITH DIFFERENTIAL/PLATELET
Basophils Absolute: 0 10*3/uL (ref 0.0–0.1)
Basophils Relative: 0 % (ref 0–1)
Eosinophils Absolute: 0.1 10*3/uL (ref 0.0–0.7)
HCT: 30 % — ABNORMAL LOW (ref 39.0–52.0)
Hemoglobin: 9.3 g/dL — ABNORMAL LOW (ref 13.0–17.0)
Lymphs Abs: 8.6 10*3/uL — ABNORMAL HIGH (ref 0.7–4.0)
MCH: 26.3 pg (ref 26.0–34.0)
MCHC: 31 g/dL (ref 30.0–36.0)
MCV: 84.7 fL (ref 78.0–100.0)
Monocytes Absolute: 0.6 10*3/uL (ref 0.1–1.0)
Neutro Abs: 3.6 10*3/uL (ref 1.7–7.7)

## 2013-06-03 LAB — COMPREHENSIVE METABOLIC PANEL
ALT: 66 U/L — ABNORMAL HIGH (ref 0–53)
AST: 79 U/L — ABNORMAL HIGH (ref 0–37)
CO2: 16 mEq/L — ABNORMAL LOW (ref 19–32)
Calcium: 9.8 mg/dL (ref 8.4–10.5)
Creatinine, Ser: 0.78 mg/dL (ref 0.50–1.35)
GFR calc Af Amer: >90 mL/min (ref >90)
GFR calc non Af Amer: >90 mL/min (ref >90)
Glucose, Bld: 191 mg/dL — ABNORMAL HIGH (ref 70–99)
Sodium: 142 mEq/L (ref 135–145)
Total Protein: 6.1 g/dL (ref 6.0–8.3)

## 2013-06-03 LAB — PREPARE RBC (CROSSMATCH)

## 2013-06-03 MED ORDER — ATROPINE SULFATE 1 MG/ML IJ SOLN
INTRAMUSCULAR | Status: AC | PRN
  Administered 2013-06-03 (×2): 1 mg via INTRAVENOUS

## 2013-06-03 MED ORDER — EPINEPHRINE HCL 0.1 MG/ML IJ SOSY
PREFILLED_SYRINGE | INTRAMUSCULAR | Status: AC | PRN
  Administered 2013-06-03 (×3): 1 mg via INTRAVENOUS
  Administered 2013-06-03: 1 via INTRAVENOUS

## 2013-06-03 MED ORDER — IOHEXOL 350 MG/ML SOLN
50.0000 mL | Freq: Once | INTRAVENOUS | Status: AC | PRN
  Administered 2013-06-03: 50 mL via INTRAVENOUS

## 2013-06-03 MED FILL — Medication: Qty: 1 | Status: AC

## 2013-06-04 ENCOUNTER — Encounter (HOSPITAL_COMMUNITY): Payer: Self-pay | Admitting: Gastroenterology

## 2013-06-04 LAB — PREPARE FRESH FROZEN PLASMA
Unit division: 0
Unit division: 0
Unit division: 0

## 2013-06-04 LAB — TYPE AND SCREEN
ABO/RH(D): O POS
Antibody Screen: NEGATIVE
Unit division: 0
Unit division: 0
Unit division: 0
Unit division: 0
Unit division: 0

## 2013-06-25 NOTE — ED Notes (Signed)
Pt transported to CT with trauma team

## 2013-06-25 NOTE — ED Notes (Signed)
Family at beside. Family given emotional support. 
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
## 2013-06-25 NOTE — ED Notes (Signed)
Dr. Lindie Spruce speaking with son.  Pt extubated per Dr. Lindie Spruce.  Monitor turned to comfort care.  [place] PD remains at bedside.

## 2013-06-25 NOTE — ED Notes (Signed)
Pulse check, femoral pulse HR 110

## 2013-06-25 NOTE — ED Notes (Signed)
Dr. Lindie Spruce, at bedside updating son of  Patients prognosis.

## 2013-06-25 NOTE — ED Notes (Signed)
Portable chest at bedside

## 2013-06-25 NOTE — ED Notes (Signed)
Dr. Lindie Spruce suturing neck wound

## 2013-06-25 NOTE — Procedures (Signed)
Central Venous Catheter Insertion Procedure Note SAAFIR ABDULLAH [id number] 1945/07/15  Procedure: Insertion of Central Venous Catheter Indications: Drug and/or fluid administration  Procedure Details Consent: Unable to obtain consent because of emergent medical necessity. Time Out: Verified patient identification, verified procedure, site/side was marked, verified correct patient position, special equipment/implants available, medications/allergies/relevent history reviewed, required imaging and test results available.  Performed  Maximum sterile technique was used including antiseptics, gloves, hand hygiene and mask. Skin prep: Iodine solution; local anesthetic administered A antimicrobial bonded/coated triple lumen catheter was placed in the right femoral vein due to emergent situation using the Seldinger technique.  Evaluation Blood flow good Complications: No apparent complications Patient Did tolerate procedure well. Chest X-ray ordered to verify placement.  CXR: Not done.  Cherylynn Ridges 06-21-13, 4:11 PM

## 2013-06-25 NOTE — Code Documentation (Signed)
Pulse check femoral pulse, CPR discontinued
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
## 2013-06-25 NOTE — ED Notes (Signed)
Per Dr. Lindie Spruce no further cpr, blood products, IV KVO.
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
## 2013-06-25 NOTE — ED Notes (Signed)
Family updated as to patient's status.

## 2013-06-25 NOTE — Procedures (Signed)
Extubation Procedure Note  Patient Details:   Name: Hunter Moss DOB: 12-02-1945 MRN: [id number]   Airway Documentation:  Airway 7.5 mm (Active)  Secured at (cm) 25 cm 06/04/2013  2:27 PM  Measured From Lips 06/04/2013  2:27 PM  Secured Location Center Jun 04, 2013  2:27 PM  Secured By [employer] Jun 04, 2013  2:27 PM    Evaluation  O2 sats: transiently fell during during procedure Complications: Complications of Extubation was terminal  Patient did not tolerate procedure well.    PT was terminally extubated and placed on 2L [place] for comfort No  [name], [name] 06/04/2013, 6:17 PM
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
## 2013-06-25 NOTE — Progress Notes (Signed)
Patient son [employer] arrived around 4pm and spoke with Dr. Lindie Spruce.  Pt. Son was accompanied and supported  By [employer]. Biomedical engineer.  Provided emotional support to Son and wife. Pt. Son asked for Chaplain support and was appreciative for support.  Will pass on to on call Chaplain for continued support.

## 2013-06-25 NOTE — ED Notes (Signed)
Patient time of death occurred at 14:40 pronounced by Dr. Lindie Spruce.

## 2013-06-25 NOTE — ED Notes (Signed)
3rd unit of FFP infused. Not allowing scan in blood doc flowsheet. Blood bank aware.

## 2013-06-25 NOTE — ED Notes (Signed)
1 black watch, 1 yellow colored ring with blue stone, and 2 yellow colored rings with white stones given to son.

## 2013-06-25 DEATH — deceased
